# Patient Record
Sex: Female | Born: 1985 | Race: Black or African American | Hispanic: No | Marital: Single | Smoking: Current every day smoker
Health system: Southern US, Community
[De-identification: ages and names within clinical notes are randomized; demographics above are authoritative.]

## PROBLEM LIST (undated history)

## (undated) DIAGNOSIS — Z789 Other specified health status: Secondary | ICD-10-CM

## (undated) HISTORY — PX: NO PAST SURGERIES: SHX2092

---

## 2008-07-05 ENCOUNTER — Encounter (HOSPITAL_COMMUNITY): Admission: RE | Admit: 2008-07-05 | Discharge: 2008-08-04 | Payer: Self-pay | Admitting: Preventative Medicine

## 2008-11-17 ENCOUNTER — Emergency Department (HOSPITAL_COMMUNITY): Admission: EM | Admit: 2008-11-17 | Discharge: 2008-11-17 | Payer: Self-pay | Admitting: Emergency Medicine

## 2008-11-24 ENCOUNTER — Emergency Department (HOSPITAL_COMMUNITY): Admission: EM | Admit: 2008-11-24 | Discharge: 2008-11-24 | Payer: Self-pay | Admitting: Emergency Medicine

## 2008-12-18 ENCOUNTER — Emergency Department (HOSPITAL_COMMUNITY): Admission: EM | Admit: 2008-12-18 | Discharge: 2008-12-18 | Payer: Self-pay | Admitting: Emergency Medicine

## 2008-12-27 ENCOUNTER — Emergency Department (HOSPITAL_COMMUNITY): Admission: EM | Admit: 2008-12-27 | Discharge: 2008-12-27 | Payer: Self-pay | Admitting: Emergency Medicine

## 2010-11-27 LAB — URINALYSIS, ROUTINE W REFLEX MICROSCOPIC
Bilirubin Urine: NEGATIVE
Glucose, UA: NEGATIVE mg/dL
Hgb urine dipstick: NEGATIVE
Ketones, ur: 15 mg/dL — AB
Nitrite: NEGATIVE
Protein, ur: NEGATIVE mg/dL
Specific Gravity, Urine: 1.02 (ref 1.005–1.030)
Urobilinogen, UA: 1 mg/dL (ref 0.0–1.0)
pH: 6.5 (ref 5.0–8.0)

## 2016-06-10 ENCOUNTER — Other Ambulatory Visit (HOSPITAL_COMMUNITY): Payer: Self-pay | Admitting: Unknown Physician Specialty

## 2016-06-10 DIAGNOSIS — Z3A20 20 weeks gestation of pregnancy: Secondary | ICD-10-CM

## 2016-06-10 DIAGNOSIS — Z3689 Encounter for other specified antenatal screening: Secondary | ICD-10-CM

## 2016-06-10 DIAGNOSIS — IMO0001 Reserved for inherently not codable concepts without codable children: Secondary | ICD-10-CM

## 2016-06-13 ENCOUNTER — Encounter (HOSPITAL_COMMUNITY): Payer: Self-pay | Admitting: Unknown Physician Specialty

## 2016-06-23 ENCOUNTER — Encounter (HOSPITAL_COMMUNITY): Payer: Self-pay | Admitting: *Deleted

## 2016-06-24 ENCOUNTER — Ambulatory Visit (HOSPITAL_COMMUNITY): Admission: RE | Admit: 2016-06-24 | Payer: Medicaid Other | Source: Ambulatory Visit

## 2016-06-24 ENCOUNTER — Other Ambulatory Visit (HOSPITAL_COMMUNITY): Payer: Self-pay | Admitting: *Deleted

## 2016-06-24 ENCOUNTER — Ambulatory Visit (HOSPITAL_COMMUNITY)
Admission: RE | Admit: 2016-06-24 | Discharge: 2016-06-24 | Disposition: A | Discharge status: Home / Self Care | Payer: Medicaid Other | Source: Ambulatory Visit | Attending: Unknown Physician Specialty | Admitting: Unknown Physician Specialty

## 2016-06-24 ENCOUNTER — Encounter (HOSPITAL_COMMUNITY): Payer: Self-pay

## 2016-06-24 DIAGNOSIS — O99212 Obesity complicating pregnancy, second trimester: Secondary | ICD-10-CM | POA: Diagnosis not present

## 2016-06-24 DIAGNOSIS — Z363 Encounter for antenatal screening for malformations: Secondary | ICD-10-CM | POA: Diagnosis not present

## 2016-06-24 DIAGNOSIS — Z3A21 21 weeks gestation of pregnancy: Secondary | ICD-10-CM | POA: Diagnosis not present

## 2016-06-24 DIAGNOSIS — O30042 Twin pregnancy, dichorionic/diamniotic, second trimester: Secondary | ICD-10-CM | POA: Insufficient documentation

## 2016-06-24 DIAGNOSIS — IMO0001 Reserved for inherently not codable concepts without codable children: Secondary | ICD-10-CM

## 2016-06-24 DIAGNOSIS — O283 Abnormal ultrasonic finding on antenatal screening of mother: Secondary | ICD-10-CM | POA: Diagnosis not present

## 2016-06-24 DIAGNOSIS — O280 Abnormal hematological finding on antenatal screening of mother: Secondary | ICD-10-CM

## 2016-06-24 DIAGNOSIS — Z315 Encounter for genetic counseling: Secondary | ICD-10-CM | POA: Insufficient documentation

## 2016-06-24 DIAGNOSIS — Z3A20 20 weeks gestation of pregnancy: Secondary | ICD-10-CM

## 2016-06-24 DIAGNOSIS — Z3689 Encounter for other specified antenatal screening: Secondary | ICD-10-CM

## 2016-06-24 HISTORY — DX: Other specified health status: Z78.9

## 2016-06-24 NOTE — Progress Notes (Signed)
Genetic Counseling  High-Risk Gestation Note  Appointment Date:  06/24/2016 Referred By: Ruthy DickMcLeod, William, MD Date of Birth:  12-Apr-1986 Partner:  Brenda Liu   Pregnancy History: [id number]: G3P1011 Estimated Date of Delivery: 10/30/16 Estimated Gestational Age: 6452w5d Attending: Alpha GulaPaul Whitecar, MD    Ms. Brenda GrumblingKristin M Liu and her partner, Mr. Brenda Liu, were seen for genetic counseling because of an increased risk for fetal Down syndrome based on Quad screen in dichorionic diamniotic twin gestation.  In summary:  Reviewed results of Quad screening test  Increased risk for Down syndrome (1 in 263)  Discussed additional screening options  NIPS-elected to pursue today ([facility])  Ultrasound-performed today; complete results under separate cover  Discussed diagnostic testing options  Amniocentesis-declined  Reviewed family history concerns  Father of the pregnancy has maternal relatives with isolated polydactyly  Discussed general population carrier screening options-elected to pursue today through [facility]  CF  SMA  Hemoglobinopathies  They were counseled regarding the screening result and the associated 1 in 263 risk for fetal Down syndrome in the current dichorionic/diamniotic twin gestation.  We reviewed chromosomes, nondisjunction, and the common features and variable prognosis of Down syndrome.  In addition, we reviewed the screen adjusted reduction in risk for open neural tube defects. The risk for trisomy 5918 was not able to be adjusted by Quad screen given the current twin gestation. We also discussed other explanations for a screen positive result including: a gestational dating error, differences in maternal metabolism, and normal variation.  We reviewed other available screening options including noninvasive prenatal screening (NIPS)/cell free DNA (cfDNA) screening and detailed ultrasound. They were counseled that screening tests are used to modify a  patient's a priori risk for aneuploidy, typically based on age. This estimate provides a pregnancy specific risk assessment. We reviewed the benefits and limitations of each option. Specifically, we discussed the conditions for which each test screens, the detection rates, and false positive rates of each. They were also counseled regarding diagnostic testing via amniocentesis. We reviewed the approximate 1 in 300-500 risk for complications from amniocentesis, including spontaneous pregnancy loss. We discussed the possible results that the tests might provide including: positive, negative, unanticipated, and no result. Finally, they were counseled regarding the cost of each option and potential out of pocket expenses. After consideration of all the options, she/they elected to proceed with NIPS ([facility]).  Those results will be available in 8-10 days.    A detailed ultrasound was performed today. The ultrasound report will be sent under separate cover. There were no visualized fetal anomalies or markers suggestive of aneuploidy. Limited views of fetal hearts were obtained, and limited views of fetal spine for baby B were obtained today. Follow-up ultrasound scheduled for 07/22/16.   After consideration of all the options, she elected to proceed with NIPS ([facility]).  Those results will be available in 8-10 days.   Diagnostic testing was declined. They understand that screening tests cannot rule out all birth defects or genetic syndromes. The patient was advised of this limitation and states she still does not want additional testing at this time.   Ms. Brenda Liu was provided with written information regarding cystic fibrosis (CF), spinal muscular atrophy (SMA) and hemoglobinopathies including the carrier frequency, availability of carrier screening and prenatal diagnosis if indicated.  In addition, we discussed that CF and hemoglobinopathies are  routinely screened for as part of the [place] newborn screening panel.  After further discussion, she elected to pursue screening  for CF, SMA and hemoglobinopathies [facility].   Both family histories were reviewed and found to be contributory for polydactyly for the father of the pregnancy's relatives. He reported his mother and two maternal cousins were born with postaxial polydactyly. His maternal uncle also reportedly was born with postaxial polydactyly of the feet. The father of the pregnancy, his maternal half-brother, and their children reportedly do not have polydactyly. Polydactyly is typically an isolated trait that can occur sporadically in a person or inherited as a familial trait.  When inherited as an isolated trait, autosomal dominant inheritance is observed, meaning each pregnancy of a parent with polydactyly has a 50% (1 in 2) chance to inherit this genetic predisposition for polydactyly.  Not all individuals that inherit this predisposition would be born with polydactyly, but they would still be at increased risk to have affected children.  Less commonly, polydactyly may be one feature of an underlying genetic condition that may or may not be inherited. Given the reported family history of apparently isolated polydactyly in his mother and maternal relatives but no polydactyly for the father of the pregnancy of his son, recurrence risk to the current pregnancy is likely low. However, if it were due to a different underlying cause, recurrence risk estimate may change. Targeted ultrasound is available to assess for polydactyly in the pregnancy. The patient understands that ultrasound cannot rule out all birth defects prenatally. Without further information regarding the provided family history, an accurate genetic risk cannot be calculated. Further genetic counseling is warranted if more information is obtained.  Ms. Brenda GrumblingKristin M Liu denied exposure to environmental toxins or  chemical agents. She denied the use of alcohol or street drugs. She reported smoking 4 cigarettes per day. The associations of smoking in pregnancy were reviewed and cessation encouraged. She denied significant viral illnesses during the course of her pregnancy. Her medical and surgical histories were noncontributory.   I counseled this couple for approximately 45 minutes regarding the above risks and available options.   Quinn PlowmanKaren Chanah Tidmore, MS,  Certified Genetic Counselor 06/24/2016

## 2016-06-25 ENCOUNTER — Other Ambulatory Visit (HOSPITAL_COMMUNITY): Payer: Self-pay | Admitting: *Deleted

## 2016-06-25 DIAGNOSIS — IMO0002 Reserved for concepts with insufficient information to code with codable children: Secondary | ICD-10-CM

## 2016-06-25 DIAGNOSIS — Z0489 Encounter for examination and observation for other specified reasons: Secondary | ICD-10-CM

## 2016-06-26 ENCOUNTER — Encounter (HOSPITAL_COMMUNITY): Payer: Self-pay

## 2016-06-26 ENCOUNTER — Other Ambulatory Visit (HOSPITAL_COMMUNITY): Payer: Self-pay

## 2016-07-01 ENCOUNTER — Telehealth (HOSPITAL_COMMUNITY): Payer: Self-pay | Admitting: MS"

## 2016-07-01 ENCOUNTER — Other Ambulatory Visit (HOSPITAL_COMMUNITY): Payer: Self-pay

## 2016-07-01 NOTE — Telephone Encounter (Signed)
Called Brenda Liu to discuss her prenatal cell free DNA test results.  Ms. Brenda Liu had [facility] testing through [facility].  Testing was offered because of abnormal Quad screen.   The patient was identified by name and DOB.  We reviewed that these are within normal limits, showing a less than 1 in 4,000 risk for trisomy 21 and less than 1 in 10,000 risk for trisomies 18 and 13. We discussed that the report is also consistent with dizygosity (fraternal twins). Thus, X chromosome analysis was not able to be performed, as we had previously discussed.  We reviewed that this testing identifies > 99% of pregnancies with trisomy 4121, trisomy 6513, and 96% of pregnancies with trisomy 1518.   Testing was also consistent with female fetal sex for both.  She understands that this testing does not identify all genetic conditions.  All questions were answered to her satisfaction, she was encouraged to call with additional questions or concerns.  Quinn PlowmanKaren Cassadee Vanzandt, MS Patent attorneyCertified Genetic Counselor

## 2016-07-01 NOTE — Telephone Encounter (Signed)
Left message with individual who answered phone for patient to return call.  Clydie BraunKaren Lonny Eisen 07/01/2016 12:11 PM

## 2016-07-03 ENCOUNTER — Telehealth (HOSPITAL_COMMUNITY): Payer: Self-pay | Admitting: MS"

## 2016-07-03 NOTE — Telephone Encounter (Signed)
Attempted to contact patient regarding results of carrier screening for cystic fibrosis, hemoglobinopathies, and SMA, which are within normal range. Left message with individual at patient's phone number for patient to return call.   Clydie BraunKaren Magali Bray 07/03/2016 9:28 AM

## 2016-07-04 ENCOUNTER — Other Ambulatory Visit (HOSPITAL_COMMUNITY): Payer: Self-pay

## 2016-07-22 ENCOUNTER — Ambulatory Visit (HOSPITAL_COMMUNITY): Payer: Medicaid Other

## 2016-07-25 ENCOUNTER — Encounter (HOSPITAL_COMMUNITY): Payer: Self-pay

## 2016-07-25 ENCOUNTER — Ambulatory Visit (HOSPITAL_COMMUNITY)
Admission: RE | Admit: 2016-07-25 | Discharge: 2016-07-25 | Disposition: A | Discharge status: Home / Self Care | Payer: Medicaid Other | Source: Ambulatory Visit | Attending: Unknown Physician Specialty | Admitting: Unknown Physician Specialty

## 2016-07-25 DIAGNOSIS — O30042 Twin pregnancy, dichorionic/diamniotic, second trimester: Secondary | ICD-10-CM | POA: Diagnosis not present

## 2016-07-25 DIAGNOSIS — Z3A26 26 weeks gestation of pregnancy: Secondary | ICD-10-CM | POA: Diagnosis not present

## 2016-07-25 DIAGNOSIS — O321XX1 Maternal care for breech presentation, fetus 1: Secondary | ICD-10-CM | POA: Diagnosis not present

## 2016-07-25 DIAGNOSIS — Z0489 Encounter for examination and observation for other specified reasons: Secondary | ICD-10-CM

## 2016-07-25 DIAGNOSIS — O280 Abnormal hematological finding on antenatal screening of mother: Secondary | ICD-10-CM

## 2016-07-25 DIAGNOSIS — IMO0002 Reserved for concepts with insufficient information to code with codable children: Secondary | ICD-10-CM

## 2016-07-28 ENCOUNTER — Other Ambulatory Visit (HOSPITAL_COMMUNITY): Payer: Self-pay | Admitting: *Deleted

## 2016-07-28 DIAGNOSIS — O30049 Twin pregnancy, dichorionic/diamniotic, unspecified trimester: Secondary | ICD-10-CM

## 2016-08-22 ENCOUNTER — Other Ambulatory Visit (HOSPITAL_COMMUNITY): Payer: Self-pay | Admitting: Obstetrics and Gynecology

## 2016-08-22 ENCOUNTER — Encounter (HOSPITAL_COMMUNITY): Payer: Self-pay

## 2016-08-22 ENCOUNTER — Ambulatory Visit (HOSPITAL_COMMUNITY)
Admission: RE | Admit: 2016-08-22 | Discharge: 2016-08-22 | Disposition: A | Discharge status: Home / Self Care | Payer: Medicaid Other | Source: Ambulatory Visit | Attending: Unknown Physician Specialty | Admitting: Unknown Physician Specialty

## 2016-08-22 DIAGNOSIS — O280 Abnormal hematological finding on antenatal screening of mother: Secondary | ICD-10-CM

## 2016-08-22 DIAGNOSIS — O99213 Obesity complicating pregnancy, third trimester: Secondary | ICD-10-CM | POA: Insufficient documentation

## 2016-08-22 DIAGNOSIS — O283 Abnormal ultrasonic finding on antenatal screening of mother: Secondary | ICD-10-CM | POA: Insufficient documentation

## 2016-08-22 DIAGNOSIS — O99333 Smoking (tobacco) complicating pregnancy, third trimester: Secondary | ICD-10-CM | POA: Insufficient documentation

## 2016-08-22 DIAGNOSIS — Z362 Encounter for other antenatal screening follow-up: Secondary | ICD-10-CM

## 2016-08-22 DIAGNOSIS — O30049 Twin pregnancy, dichorionic/diamniotic, unspecified trimester: Secondary | ICD-10-CM

## 2016-08-22 DIAGNOSIS — Z3A30 30 weeks gestation of pregnancy: Secondary | ICD-10-CM

## 2016-08-22 DIAGNOSIS — O30043 Twin pregnancy, dichorionic/diamniotic, third trimester: Secondary | ICD-10-CM | POA: Insufficient documentation

## 2016-09-19 ENCOUNTER — Encounter (HOSPITAL_COMMUNITY): Payer: Self-pay

## 2016-09-19 ENCOUNTER — Ambulatory Visit (HOSPITAL_COMMUNITY)
Admission: RE | Admit: 2016-09-19 | Discharge: 2016-09-19 | Disposition: A | Discharge status: Home / Self Care | Payer: Medicaid Other | Source: Ambulatory Visit | Attending: Unknown Physician Specialty | Admitting: Unknown Physician Specialty

## 2016-09-19 DIAGNOSIS — O30043 Twin pregnancy, dichorionic/diamniotic, third trimester: Secondary | ICD-10-CM | POA: Insufficient documentation

## 2016-09-19 DIAGNOSIS — O99333 Smoking (tobacco) complicating pregnancy, third trimester: Secondary | ICD-10-CM | POA: Diagnosis not present

## 2016-09-19 DIAGNOSIS — O280 Abnormal hematological finding on antenatal screening of mother: Secondary | ICD-10-CM

## 2016-09-19 DIAGNOSIS — Z3A34 34 weeks gestation of pregnancy: Secondary | ICD-10-CM | POA: Insufficient documentation

## 2016-09-19 DIAGNOSIS — Z362 Encounter for other antenatal screening follow-up: Secondary | ICD-10-CM | POA: Diagnosis not present

## 2016-09-19 DIAGNOSIS — O99213 Obesity complicating pregnancy, third trimester: Secondary | ICD-10-CM | POA: Insufficient documentation

## 2016-09-19 DIAGNOSIS — O283 Abnormal ultrasonic finding on antenatal screening of mother: Secondary | ICD-10-CM | POA: Diagnosis not present

## 2016-09-19 DIAGNOSIS — O30049 Twin pregnancy, dichorionic/diamniotic, unspecified trimester: Secondary | ICD-10-CM

## 2017-02-17 ENCOUNTER — Emergency Department (HOSPITAL_COMMUNITY): Payer: Medicaid Other

## 2017-02-17 ENCOUNTER — Emergency Department (HOSPITAL_COMMUNITY)
Admission: EM | Admit: 2017-02-17 | Discharge: 2017-02-17 | Disposition: A | Discharge status: Home / Self Care | Payer: Medicaid Other | Attending: Emergency Medicine | Admitting: Emergency Medicine

## 2017-02-17 ENCOUNTER — Encounter: Payer: Self-pay | Admitting: Emergency Medicine

## 2017-02-17 DIAGNOSIS — R300 Dysuria: Secondary | ICD-10-CM | POA: Diagnosis present

## 2017-02-17 DIAGNOSIS — M25562 Pain in left knee: Secondary | ICD-10-CM | POA: Insufficient documentation

## 2017-02-17 DIAGNOSIS — N3001 Acute cystitis with hematuria: Secondary | ICD-10-CM | POA: Diagnosis not present

## 2017-02-17 DIAGNOSIS — F17200 Nicotine dependence, unspecified, uncomplicated: Secondary | ICD-10-CM | POA: Insufficient documentation

## 2017-02-17 LAB — URINALYSIS, ROUTINE W REFLEX MICROSCOPIC
Bilirubin Urine: NEGATIVE
GLUCOSE, UA: NEGATIVE mg/dL
KETONES UR: NEGATIVE mg/dL
NITRITE: NEGATIVE
PROTEIN: 100 mg/dL — AB
Specific Gravity, Urine: 1.023 (ref 1.005–1.030)
pH: 7 (ref 5.0–8.0)

## 2017-02-17 LAB — PREGNANCY, URINE: Preg Test, Ur: NEGATIVE

## 2017-02-17 MED ORDER — PHENAZOPYRIDINE HCL 200 MG PO TABS: 200.0000 mg | ORAL_TABLET | Freq: Three times a day (TID) | ORAL | 0 refills | Status: DC

## 2017-02-17 MED ORDER — IBUPROFEN 600 MG PO TABS: 600.0000 mg | ORAL_TABLET | Freq: Four times a day (QID) | ORAL | 0 refills | Status: DC | PRN

## 2017-02-17 MED ORDER — CEPHALEXIN 500 MG PO CAPS: 500.0000 mg | ORAL_CAPSULE | Freq: Four times a day (QID) | ORAL | 0 refills | Status: DC

## 2017-02-17 NOTE — ED Notes (Signed)
PA at the bedside.

## 2017-02-17 NOTE — ED Provider Notes (Signed)
[facility] DEPT Provider Note   CSN: [id number] Arrival date & time: 02/17/17  [id number]     History   Chief Complaint Chief Complaint  Patient presents with  . Knee Injury  . Dysuria    pressure after voiding    HPI Brenda Liu is a 31 y.o. female who is 4 months post partum presenting with 2 complaints, the first being a 2 day history of urinary frequency along with painful pressure sensation at the end of her urine stream in association with low back pressure like pain.  She denies fever, chills, n/v flank pain and has had no medicines prior to arrival for this problem.  Secondly, has a chronic intermittent popping and clicking sensation left knee with certain movements since she planted and twisted the knee joint 2 months ago.  She endorses intermittent episodes of worsened pain and swelling which does respond to rest and ice packs.  She has not been seen for this complaint prior to today.  She is currently menstruating and is not breast feeding.  The history is provided by the patient.    Past Medical History:  Diagnosis Date  . Medical history non-contributory     Patient Active Problem List   Diagnosis Date Noted  . Abnormal maternal serum screening test 06/24/2016  . [redacted] weeks gestation of pregnancy     Past Surgical History:  Procedure Laterality Date  . NO PAST SURGERIES      OB History    Gravida Para Term Preterm AB Living   3 1 1   1 1    SAB TAB Ectopic Multiple Live Births   1               Home Medications    Prior to Admission medications   Medication Sig Start Date End Date Taking? Authorizing Provider  cephALEXin (KEFLEX) 500 MG capsule Take 1 capsule (500 mg total) by mouth 4 (four) times daily. 02/17/17   Burgess AmorIdol, Adolph Clutter, PA-C  ibuprofen (ADVIL,MOTRIN) 600 MG tablet Take 1 tablet (600 mg total) by mouth every 6 (six) hours as needed (knee pain). 02/17/17   Burgess AmorIdol, Keirah Konitzer, PA-C  phenazopyridine (PYRIDIUM) 200 MG tablet Take 1 tablet (200 mg total) by  mouth 3 (three) times daily. 02/17/17   Burgess AmorIdol, Kinslea Frances, PA-C    Family History No family history on file.  Social History Social History  Substance Use Topics  . Smoking status: Current Every Day Smoker    Packs/day: 0.25  . Smokeless tobacco: Never Used  . Alcohol use No     Allergies   Shellfish allergy   Review of Systems Review of Systems  Constitutional: Negative for fever.  Genitourinary: Positive for dysuria, hematuria, urgency and vaginal bleeding. Negative for flank pain and vaginal discharge.  Musculoskeletal: Positive for arthralgias and joint swelling. Negative for myalgias.  Neurological: Negative for weakness and numbness.     Physical Exam Updated Vital Signs BP 138/80 (BP Location: Left Arm)   Pulse 96   Temp 97.7 F (36.5 C) (Oral)   Resp 16   Ht 5\' 7"  (1.702 m)   Wt 127 kg (280 lb)   LMP 02/15/2017   SpO2 100%   Breastfeeding? Unknown   BMI 43.85 kg/m   Physical Exam  Constitutional: She appears well-developed and well-nourished.  HENT:  Head: Normocephalic and atraumatic.  Eyes: Conjunctivae are normal.  Neck: Normal range of motion.  Cardiovascular: Normal rate, regular rhythm and normal heart sounds.   Pulses equal bilaterally  Pulmonary/Chest: Effort normal and breath sounds normal. She has no wheezes.  Abdominal: Soft. Bowel sounds are normal. She exhibits no distension and no mass. There is no tenderness. There is no guarding and no CVA tenderness.  Musculoskeletal: Normal range of motion. She exhibits tenderness.       Left knee: She exhibits normal range of motion, no swelling, no effusion, no ecchymosis, no deformity, no LCL laxity, normal meniscus and no MCL laxity. Tenderness found. Patellar tendon tenderness noted.  ttp anterior inferior patella and tendon without palpable deformity or disruption.  No crepitus appreciated at this time.  Knee joint stable.  Neurological: She is alert. She has normal strength. She displays normal  reflexes. No sensory deficit.  Skin: Skin is warm and dry.  Psychiatric: She has a normal mood and affect.  Nursing note and vitals reviewed.    ED Treatments / Results  Labs (all labs ordered are listed, but only abnormal results are displayed) Labs Reviewed  URINALYSIS, ROUTINE W REFLEX MICROSCOPIC - Abnormal; Notable for the following:       Result Value   APPearance CLOUDY (*)    Hgb urine dipstick LARGE (*)    Protein, ur 100 (*)    Leukocytes, UA LARGE (*)    Bacteria, UA MANY (*)    Squamous Epithelial / LPF 0-5 (*)    All other components within normal limits  URINE CULTURE  PREGNANCY, URINE    EKG  EKG Interpretation None       Radiology Dg Knee Complete 4 Views Left  Result Date: 02/17/2017 CLINICAL DATA:  Left knee pain following twisting injury, initial encounter EXAM: LEFT KNEE - COMPLETE 4+ VIEW COMPARISON:  None. FINDINGS: No acute fracture or dislocation is noted. Mild spurring is noted arising from the superior aspect of the patella. No other focal abnormality is noted. IMPRESSION: No acute abnormality noted. Electronically Signed   By: Alcide Clever M.D.   On: 02/17/2017 11:56    Procedures Procedures (including critical care time)  Medications Ordered in ED Medications - No data to display   Initial Impression / Assessment and Plan / ED Course  I have reviewed the triage vital signs and the nursing notes.  Pertinent labs & imaging results that were available during my care of the patient were reviewed by me and considered in my medical decision making (see chart for details).     Pt currently menstruating, refused cath, so she gave the best clean catch possible.  Will cover for uti, culture ordered.  Referral to ortho for further eval of knee sx, films negative, suspect cartilage/meniscal injury.  Final Clinical Impressions(s) / ED Diagnoses   Final diagnoses:  Acute cystitis with hematuria  Acute pain of left knee    New Prescriptions New  Prescriptions   CEPHALEXIN (KEFLEX) 500 MG CAPSULE    Take 1 capsule (500 mg total) by mouth 4 (four) times daily.   IBUPROFEN (ADVIL,MOTRIN) 600 MG TABLET    Take 1 tablet (600 mg total) by mouth every 6 (six) hours as needed (knee pain).   PHENAZOPYRIDINE (PYRIDIUM) 200 MG TABLET    Take 1 tablet (200 mg total) by mouth 3 (three) times daily.     Burgess Amor, PA-C 02/17/17 1217    Loren Racer, MD 02/19/17 1146

## 2017-02-17 NOTE — ED Triage Notes (Signed)
Pt twisted her left knee in May when playing ball, continues to have pain. Pt also complaining of pressure after voiding for the past 2 days.

## 2017-02-17 NOTE — Discharge Instructions (Signed)
Take your entire course of the antibiotics prescribed.  Remember that pyridium will turn your urine bright orange/yellow and is normal. Get rechecked if you develop any worsened pain, fevers, nausea or vomiting. Your knee xray does not show us the source of your symptoms.  You may need further testing as an injury to the cartilage can cause your symptoms but will not show on an xray.

## 2017-02-17 NOTE — ED Notes (Signed)
Patient given discharge instruction, verbalized understand. Patient ambulatory out of the department.  

## 2017-02-17 NOTE — ED Notes (Signed)
Pt ambulatory to the bathroom, with wash cloth and wipes

## 2017-02-20 LAB — URINE CULTURE
Culture: >=100000 — AB
SPECIAL REQUESTS: NORMAL

## 2017-02-21 ENCOUNTER — Telehealth: Payer: Self-pay

## 2017-02-21 NOTE — Telephone Encounter (Signed)
Post ED Visit - Positive Culture Follow-up  Culture report reviewed by antimicrobial stewardship pharmacist:  []  Enzo BiNathan Batchelder, Pharm.D. []  Celedonio MiyamotoJeremy Frens, Pharm.D., BCPS AQ-ID []  Garvin FilaMike Maccia, Pharm.D., BCPS []  Georgina PillionElizabeth Martin, Pharm.D., BCPS []  TaylorvilleMinh Pham, [address] Rainbow BoulevardPharm.D., BCPS, AAHIVP []  Estella HuskMichelle Turner, Pharm.D., BCPS, AAHIVP []  Lysle Pearlachel Rumbarger, PharmD, BCPS []  Casilda Carlsaylor Stone, PharmD, BCPS []  Pollyann SamplesAndy Johnston, PharmD, BCPS [place] HunAllison Masters Pharm D Positive urine culture Treated with Cephalexin, organism sensitive to the same and no further patient follow-up is required at this time.  Jerry CarasCullom, Jabriel Vanduyne Burnett 02/21/2017, 9:52 AM

## 2017-07-01 DIAGNOSIS — Z8482 Family history of sudden infant death syndrome: Secondary | ICD-10-CM | POA: Insufficient documentation

## 2017-07-01 DIAGNOSIS — O34219 Maternal care for unspecified type scar from previous cesarean delivery: Secondary | ICD-10-CM | POA: Insufficient documentation

## 2017-10-12 IMAGING — US US MFM OB FOLLOW-UP EACH ADDL GEST (MODIFY)
1 series · 12 of 28 positions shown · non-contrast
Comparison: none

[Series 1: us mfm ob follow-up each addl gest (modify) · 33 acquisitions, 12 frames shown]
[im 2/33]
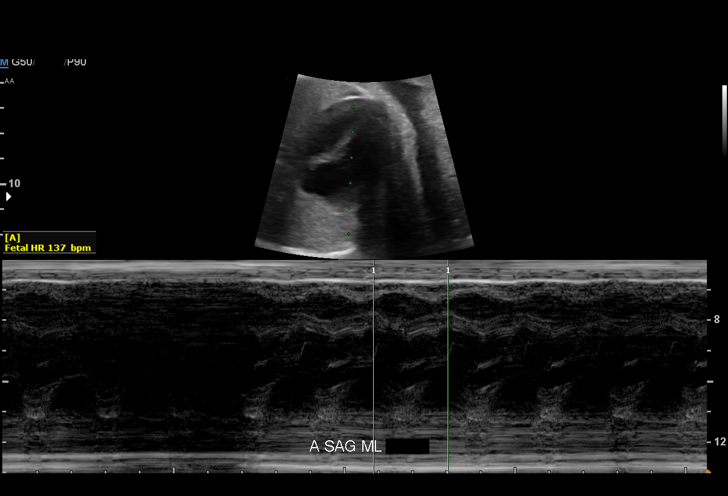
[im 4/33]
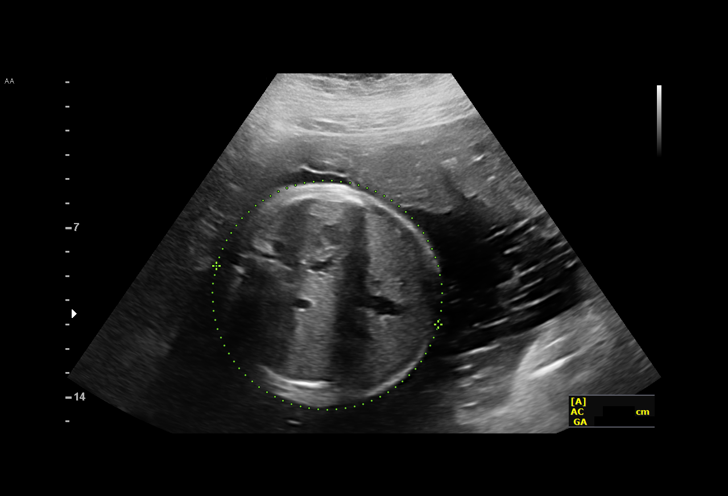
[im 6/33]
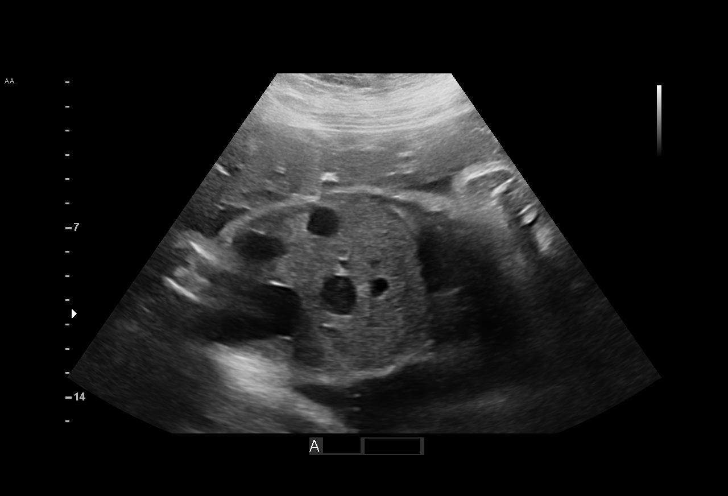
[im 10/33]
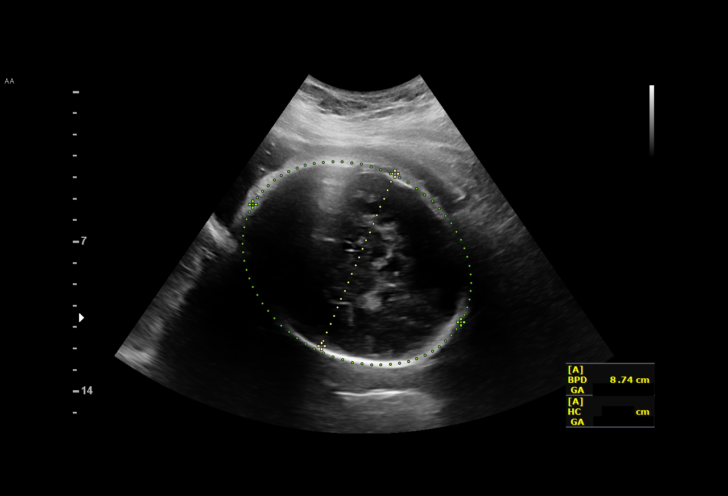
[im 12/33]
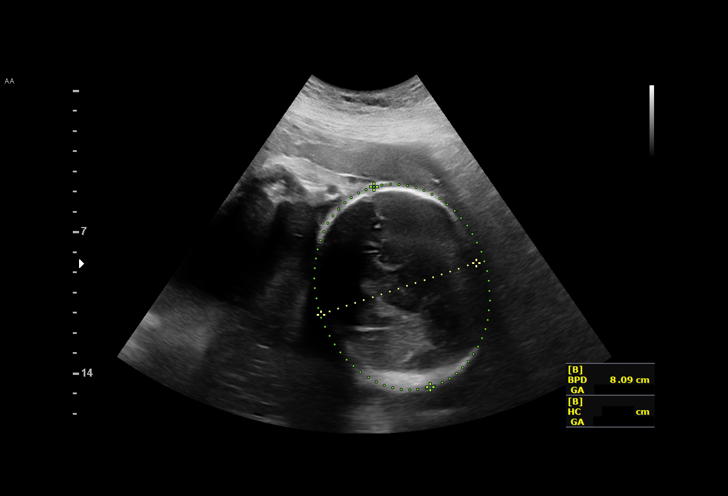
[im 15/33]
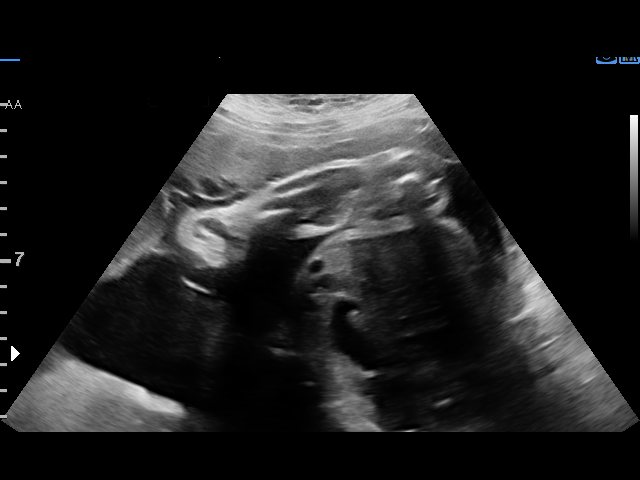
[im 18/33]
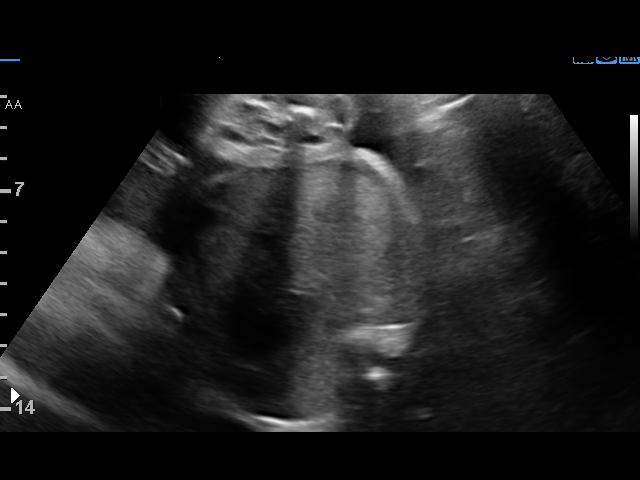
[im 21/33]
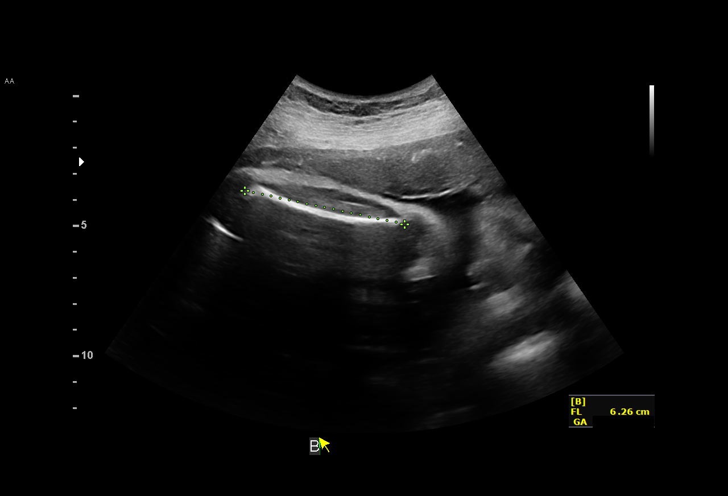
[im 23/33]
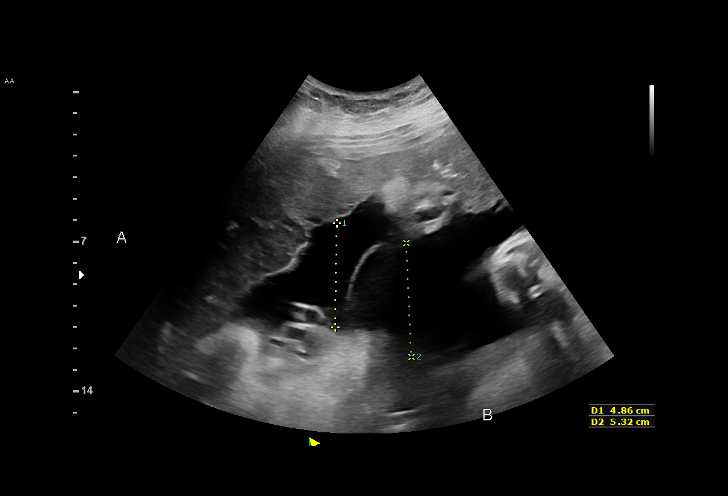
[im 27/33]
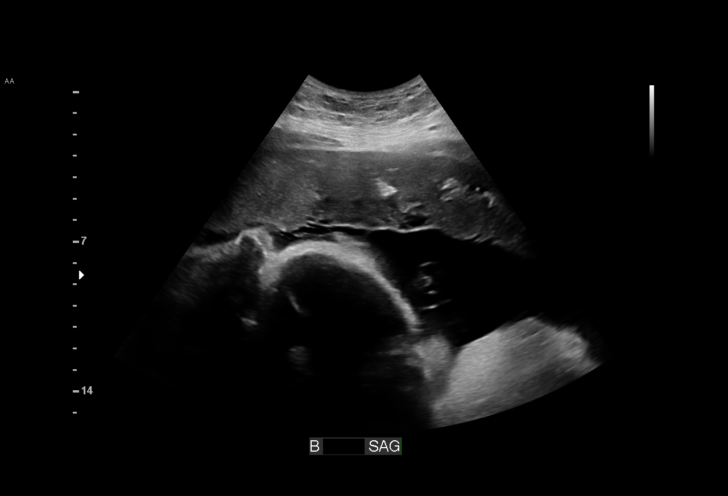
[im 29/33]
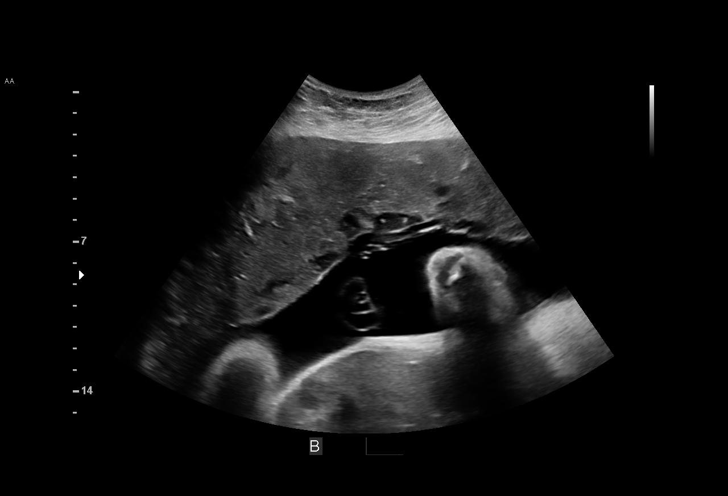
[im 31/33]
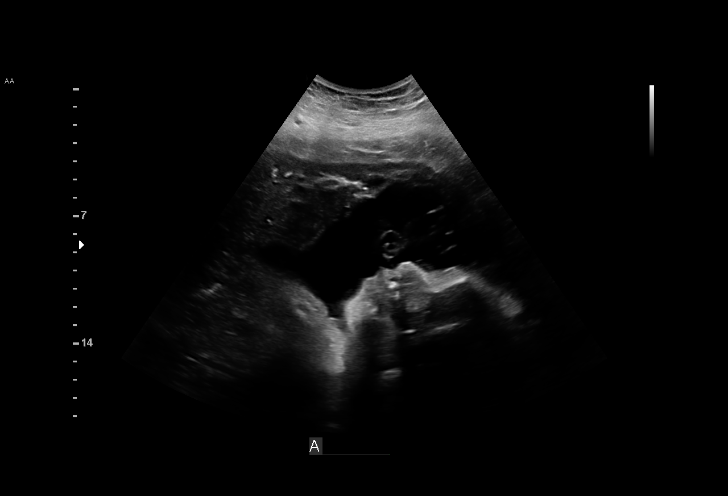

[12 of 28 positions shown; findings below may reference images not displayed]

Center
[REDACTED] [id number]

1  Putrica Muslem            [id number]       [id number]     [id number]
2  CLEMENT SCHENK            [id number]       [id number]     [id number]
Indications

34 weeks gestation of pregnancy
Abnormal biochemical screen (low risk NIPS)
Encounter for other antenatal screening
follow-up
Tobacco use complicating pregnancy, third
trimester
Obesity complicating pregnancy, third
trimester
Twin pregnancy, di/di, third trimester
OB History

Blood Type:            Height:         Weight (lb):  308      BMI:
Gravidity:    3         Term:   1        Prem:   0        SAB:   1
TOP:          0       Ectopic:  0        Living: 1
Fetal Evaluation (Fetus A)

Num Of Fetuses:     2
Fetal Heart         137
Rate(bpm):
Cardiac Activity:   Observed
Fetal Lie:          Lower Fetus
Presentation:       Cephalic
Placenta:           Anterior, above cervical os
Membrane Desc:      Dividing Membrane seen - Dichorionic.
Amniotic Fluid
AFI FV:      Subjectively within normal limits

Largest Pocket(cm)
4.9
Biometry (Fetus A)

BPD:      87.4  mm     G. Age:  35w 2d         80  %    CI:        76.24   %   70 - 86
FL/HC:      20.7   %   19.4 -
HC:      317.2  mm     G. Age:  35w 4d         53  %    HC/AC:      1.07       0.96 -
AC:       296   mm     G. Age:  33w 4d         38  %    FL/BPD:     75.2   %   71 - 87
FL:       65.7  mm     G. Age:  33w 6d         33  %    FL/AC:      22.2   %   20 - 24

Est. FW:    7555  gm      5 lb 2 oz     57  %     FW Discordancy     0 \ 14 %
Gestational Age (Fetus A)

U/S Today:     34w 4d                                        EDD:   10/27/16
Best:          34w 1d    Det. By:   Previous Ultrasound      EDD:   10/30/16
(05/27/16)
Anatomy (Fetus A)

Cranium:               Appears normal         Aortic Arch:            Previously seen
Cavum:                 Previously seen        Ductal Arch:            Previously seen
Ventricles:            Previously seen        Diaphragm:              Appears normal
Choroid Plexus:        Previously seen        Stomach:                Appears normal, left
sided
Cerebellum:            Previously seen        Abdomen:                Previously seen
Posterior Fossa:       Previously seen        Abdominal Wall:         Previously seen
Nuchal Fold:           Previously seen        Cord Vessels:           Previously seen
Face:                  Orbits and profile     Kidneys:                Appear normal
previously seen
Lips:                  Previously seen        Bladder:                Appears normal
Thoracic:              Previously seen        Spine:                  Previously seen
Heart:                 Previously seen        Upper Extremities:      Previously seen
RVOT:                  Previously seen        Lower Extremities:      Previously seen
LVOT:                  Previously seen

Other:  Male gender previously seen. Heels previously seen. Technically
difficult due to maternal habitus and fetal position.

Fetal Evaluation (Fetus B)

Num Of Fetuses:     2
Fetal Heart         148
Rate(bpm):
Cardiac Activity:   Observed
Fetal Lie:          Upper Fetus
Presentation:       Transverse, head to maternal left
Placenta:           Anterior, above cervical os
Membrane Desc:      Dividing Membrane seen - Dichorionic.

Amniotic Fluid
AFI FV:      Subjectively within normal limits

Largest Pocket(cm)
5.3
Biometry (Fetus B)

BPD:      81.3  mm     G. Age:  32w 5d         11  %    CI:         73.8   %   70 - 86
FL/HC:      20.8   %   19.4 -
HC:      300.6  mm     G. Age:  33w 3d          7  %    HC/AC:      1.05       0.96 -
AC:      285.3  mm     G. Age:  32w 4d         14  %    FL/BPD:     76.9   %   71 - 87
FL:       62.5  mm     G. Age:  32w 2d          7  %    FL/AC:      21.9   %   20 - 24

Est. FW:    2225  gm      4 lb 7 oz     30  %     FW Discordancy        14  %
Gestational Age (Fetus B)

U/S Today:     32w 5d                                        EDD:   11/09/16
Best:          34w 1d    Det. By:   Previous Ultrasound      EDD:   10/30/16
(05/27/16)
Anatomy (Fetus B)

Cranium:               Appears normal         Aortic Arch:            Previously seen
Cavum:                 Previously seen        Ductal Arch:            Previously seen
Ventricles:            Previously seen        Diaphragm:              Appears normal
Choroid Plexus:        Previously seen        Stomach:                Appears normal, left
sided
Cerebellum:            Previously seen        Abdomen:                Previously seen
Posterior Fossa:       Previously seen        Abdominal Wall:         Not well visualized
Nuchal Fold:           Previously seen        Cord Vessels:           Previously seen
Face:                  Orbits and profile     Kidneys:                Appear normal
previously seen
Lips:                  Previously seen        Bladder:                Appears normal
Thoracic:              Previously seen        Spine:                  Previously seen
Heart:                 Previously seen        Upper Extremities:      Previously seen
RVOT:                  Previously seen        Lower Extremities:      Previously seen
LVOT:                  Previously seen

Other:  Male gender previously seen. Nasal bone previously seen.
Technically difficult due to maternal habitus and fetal position.
Impression

Dichorionic/diamniotic twin pregnancy at 34+1 weeks
Cephalic/transverse presentation
Normal interval anatomy x 2; anatomic survey complete x 2
Normal amniotic fluid volume x 2
Appropriate interval growths with EFWs at the 57th %tile and
30th %tile
Recommendations

Follow-up as clinically indicated
Delivery recommended by 38 weeks

## 2019-11-30 ENCOUNTER — Other Ambulatory Visit: Payer: Self-pay | Admitting: Obstetrics & Gynecology

## 2019-11-30 ENCOUNTER — Telehealth: Payer: Self-pay | Admitting: Obstetrics & Gynecology

## 2019-11-30 DIAGNOSIS — O3680X0 Pregnancy with inconclusive fetal viability, not applicable or unspecified: Secondary | ICD-10-CM

## 2019-11-30 NOTE — Telephone Encounter (Signed)

## 2019-12-01 ENCOUNTER — Ambulatory Visit (INDEPENDENT_AMBULATORY_CARE_PROVIDER_SITE_OTHER): Payer: Medicaid Other

## 2019-12-01 ENCOUNTER — Other Ambulatory Visit: Payer: Self-pay

## 2019-12-01 DIAGNOSIS — Z3A01 Less than 8 weeks gestation of pregnancy: Secondary | ICD-10-CM

## 2019-12-01 DIAGNOSIS — O3680X0 Pregnancy with inconclusive fetal viability, not applicable or unspecified: Secondary | ICD-10-CM

## 2019-12-01 NOTE — Progress Notes (Signed)
[place] 6+3 wks,single IUP with YS,positive fht 115 bpm,normal ovaries,crl 6.08 mm,subchorionic hemorrhage 1.6 x 1.8 x .7 cm

## 2020-01-11 ENCOUNTER — Telehealth: Payer: Self-pay | Admitting: Women's Health

## 2020-01-11 ENCOUNTER — Other Ambulatory Visit: Payer: Self-pay | Admitting: Obstetrics and Gynecology

## 2020-01-11 DIAGNOSIS — Z3682 Encounter for antenatal screening for nuchal translucency: Secondary | ICD-10-CM

## 2020-01-11 NOTE — Telephone Encounter (Signed)
Unable to leave voicemail for patient to review covid restrictions and covid health screening questions. Will screen patient upon check in at upcoming appointment.

## 2020-01-12 ENCOUNTER — Ambulatory Visit (INDEPENDENT_AMBULATORY_CARE_PROVIDER_SITE_OTHER): Payer: Medicaid Other

## 2020-01-12 ENCOUNTER — Encounter: Payer: Self-pay | Admitting: Women's Health

## 2020-01-12 ENCOUNTER — Ambulatory Visit: Payer: Medicaid Other | Admitting: *Deleted

## 2020-01-12 ENCOUNTER — Ambulatory Visit (INDEPENDENT_AMBULATORY_CARE_PROVIDER_SITE_OTHER): Payer: Medicaid Other | Admitting: Women's Health

## 2020-01-12 VITALS — BP 127/72 | HR 89 | Wt 323.8 lb

## 2020-01-12 DIAGNOSIS — O34219 Maternal care for unspecified type scar from previous cesarean delivery: Secondary | ICD-10-CM

## 2020-01-12 DIAGNOSIS — Z6841 Body Mass Index (BMI) 40.0 and over, adult: Secondary | ICD-10-CM

## 2020-01-12 DIAGNOSIS — F17200 Nicotine dependence, unspecified, uncomplicated: Secondary | ICD-10-CM | POA: Insufficient documentation

## 2020-01-12 DIAGNOSIS — Z3A13 13 weeks gestation of pregnancy: Secondary | ICD-10-CM | POA: Diagnosis not present

## 2020-01-12 DIAGNOSIS — O99331 Smoking (tobacco) complicating pregnancy, first trimester: Secondary | ICD-10-CM | POA: Diagnosis not present

## 2020-01-12 DIAGNOSIS — Z3682 Encounter for antenatal screening for nuchal translucency: Secondary | ICD-10-CM

## 2020-01-12 DIAGNOSIS — O99211 Obesity complicating pregnancy, first trimester: Secondary | ICD-10-CM

## 2020-01-12 DIAGNOSIS — Z3A12 12 weeks gestation of pregnancy: Secondary | ICD-10-CM

## 2020-01-12 DIAGNOSIS — Z3481 Encounter for supervision of other normal pregnancy, first trimester: Secondary | ICD-10-CM

## 2020-01-12 DIAGNOSIS — E6609 Other obesity due to excess calories: Secondary | ICD-10-CM | POA: Diagnosis not present

## 2020-01-12 DIAGNOSIS — Z3480 Encounter for supervision of other normal pregnancy, unspecified trimester: Secondary | ICD-10-CM | POA: Insufficient documentation

## 2020-01-12 DIAGNOSIS — Z8482 Family history of sudden infant death syndrome: Secondary | ICD-10-CM

## 2020-01-12 LAB — POCT URINALYSIS DIPSTICK OB
Blood, UA: NEGATIVE
Glucose, UA: NEGATIVE
Ketones, UA: NEGATIVE
Leukocytes, UA: NEGATIVE
Nitrite, UA: NEGATIVE
POC,PROTEIN,UA: NEGATIVE

## 2020-01-12 MED ORDER — DOXYLAMINE-PYRIDOXINE 10-10 MG PO TBEC: DELAYED_RELEASE_TABLET | ORAL | 6 refills | Status: DC

## 2020-01-12 MED ORDER — HYDROCORTISONE 1 % EX OINT: 1.0000 "application " | TOPICAL_OINTMENT | Freq: Two times a day (BID) | CUTANEOUS | 2 refills | Status: AC

## 2020-01-12 NOTE — Progress Notes (Signed)
INITIAL OBSTETRICAL VISIT Patient name: Brenda Liu MRN [id number]  Date of birth: 04/10/1986 Chief Complaint:   Initial Prenatal Visit (nt/it)  History of Present Illness:   Brenda Liu is a 34 y.o. [phone] African American female at [redacted]w[redacted]d by 6wk u/s, with an Estimated Date of Delivery: 07/23/20 being seen today for her initial obstetrical visit.  Epic down during entire visit. Her obstetrical history is significant for SVB x 1 then C/S for twins w/ baby B 'not moving very much', then elective RCS. Twin B passed away at old from SIDS.  Denies h/o HTN, DM, PTL, etc. Smoker- 1.5ppd prior to pregnancy, now down to 1/2ppd, usually continues to decrease amount as pregnancy progressed but doesn't quit completely. Declines QuitlineNC.  Eczema b/w breasts, PCP gave her hydrocortisone which isn't really helping- but is only using 2x/wk instead of BID.  Today she reports nausea, requests meds.  Depression screen [facility] 2/9 01/12/2020  Decreased Interest 0  Down, Depressed, Hopeless 0  PHQ - 2 Score 0  Altered sleeping 0  Tired, decreased energy 3  Change in appetite 0  Feeling bad or failure about yourself  0  Trouble concentrating 0  Moving slowly or fidgety/restless 0  Suicidal thoughts 0  PHQ-9 Score 3  Difficult doing work/chores Not difficult at all    No LMP recorded (lmp unknown). Patient is pregnant. Last pap 2019. Results were: normal Review of Systems:   Pertinent items are noted in HPI Denies cramping/contractions, leakage of fluid, vaginal bleeding, abnormal vaginal discharge w/ itching/odor/irritation, headaches, visual changes, shortness of breath, chest pain, abdominal pain, severe nausea/vomiting, or problems with urination or bowel movements unless otherwise stated above.  Pertinent History Reviewed:  Reviewed past medical,surgical, social, obstetrical and family history.  Reviewed problem list, medications and allergies. OB History  Gravida Para Term Preterm  AB Living  5 3 3   1 3   SAB TAB Ectopic Multiple Live Births  1     1 4     # Outcome Date GA Lbr Len/2nd Weight Sex Delivery Anes PTL Lv  5 Current           4 Term 01/05/18 [redacted]w[redacted]d  6 lb 8 oz (2.948 kg) F CS-LTranv Spinal N LIV  3A Term 10/14/16 [redacted]w[redacted]d  6 lb 4 oz (2.835 kg) M CS-LTranv Spinal N LIV  3B Term 10/14/16 [redacted]w[redacted]d  4 lb 6 oz (1.984 kg) M CS-LTranv Spinal N DEC  2 SAB 2017          1 Term 04/01/15 [redacted]w[redacted]d  6 lb 8 oz (2.948 kg) M Vag-Spont EPI N LIV   Physical Assessment:   Vitals:   01/12/20 1137  BP: 127/72  Pulse: 89  Weight: (!) 323 lb 12.8 oz (146.9 kg)  Body mass index is 50.71 kg/m.       Physical Examination:  General appearance - well appearing, and in no distress  Mental status - alert, oriented to person, place, and time  Psych:  She has a normal mood and affect  Skin - warm and dry, normal color, no suspicious lesions noted, eczematous rash on breasts/chest  Chest - effort normal, all lung fields clear to auscultation bilaterally  Heart - normal rate and regular rhythm  Abdomen - soft, nontender  Extremities:  No swelling or varicosities noted  Pelvic - VULVA: normal appearing vulva with no masses, tenderness or lesions  VAGINA: normal appearing vagina with normal color and discharge, no lesions  CERVIX: normal appearing  cervix without discharge or lesions, no CMT  Thin prep pap is not done   TODAY'S NT [place] 12+3 wks,measurements c/w dates,63 mm,NB present,NT 2 mm,fhr 141 bpm,posterior fundal placenta,normal ovaries  Results for orders placed or performed in visit on 01/12/20 (from the past 24 hour(s))  POC Urinalysis Dipstick OB   Collection Time: 01/12/20  2:02 PM  Result Value Ref Range   Color, UA     Clarity, UA     Glucose, UA Negative Negative   Bilirubin, UA     Ketones, UA neg    Spec Grav, UA     Blood, UA neg    pH, UA     POC,PROTEIN,UA Negative Negative, Trace, Small (1+), Moderate (2+), Large (3+), 4+   Urobilinogen, UA     Nitrite, UA neg     Leukocytes, UA Negative Negative   Appearance     Odor      Assessment & Plan:  1) Low-Risk Pregnancy [id number] at [redacted]w[redacted]d with an Estimated Date of Delivery: 07/23/20   2) Initial OB visit  3) Prev c/s x2> wants RCS w/ BTL  4) H/O old son passing away d/t SIDS  5) Smoker> Smokes 1/2pp/day, counseled x 3-41mins, advised cessation, discussed risks to fetus while pregnant, to infant pp, and to herself. Offered QuitlineNC, declined.  6) Nausea> rx diclegis  7) Eczema> rx hydrocortisone, use BID  Meds:  Meds ordered this encounter  Medications  . Doxylamine-Pyridoxine (DICLEGIS) 10-10 MG TBEC    Sig: 2 tabs q hs, if sx persist add 1 tab q am on day 3, if sx persist add 1 tab q afternoon on day 4    Dispense:  100 tablet    Refill:  6    Order Specific Question:   Supervising Provider    Answer:   Despina Hidden, LUTHER H [[id number]]  . hydrocortisone 1 % ointment    Sig: Apply 1 application topically 2 (two) times daily.    Dispense:  30 g    Refill:  2    Order Specific Question:   Supervising Provider    Answer:   Duane Lope H [[id number]]    Initial labs obtained Continue prenatal vitamins Reviewed n/v relief measures and warning s/s to report Reviewed recommended weight gain based on pre-gravid BMI Encouraged well-balanced diet Genetic & carrier screening discussed: requests Panorama, NT/IT and Horizon 14   Declines UDS, reports she used THC prior to pregnancy and doesn't want it to show in her urine today.  Ultrasound discussed; fetal survey: requested CCNC completed> form faxed if has or is planning to apply for medicaid The nature of [facility] [employer] with multiple MDs and other Advanced Practice Providers was explained to patient; also emphasized that fellows, residents, and students are part of our team. Has home bp cuff.  Check bp weekly, let us know if >140/90.   Indications for early A1C (per uptodate) BMI >=25 (>=23 in Asian women) AND one of the  following High-risk race/ethnicity (eg, African American, Latino, Native American, [place] American, [place] Islander) Yes  Other clinical condition associated with insulin resistance (eg, severe obesity, acanthosis nigricans) Yes   Follow-up: Return in about 3 weeks (around 02/02/2020) for LROB, MyChart Video, CNM.   Orders Placed This Encounter  Procedures  . Urine Culture  . GC/Chlamydia Probe Amp  . Genetic Screening  . CBC/D/Plt+RPR+Rh+ABO+Rub Ab...  . Integrated 1  . Hemoglobin A1c  . POC Urinalysis Dipstick OB  [place], [facility] 01/12/2020 2:16 PM

## 2020-01-12 NOTE — Progress Notes (Signed)
[place] 12+3 wks,measurements c/w dates,63 mm,NB present,NT 2 mm,fhr 141 bpm,posterior fundal placenta,normal ovaries

## 2020-01-12 NOTE — Patient Instructions (Signed)
Brenda Liu, I greatly value your feedback.  If you receive a survey following your visit with [place] today, we appreciate you taking the time to fill it out.  Thanks, Knute Neu, CNM, WHNP-BC   [facility] [place] [facility] ([place], [place] [address]) Entrance C, located off of [address] parking   Nausea & Vomiting  Have saltine crackers or pretzels by your bed and eat a few bites before you raise your head out of bed in the morning  Eat small frequent meals throughout the day instead of large meals  Drink plenty of fluids throughout the day to stay hydrated, just don't drink a lot of fluids with your meals.  This can make your stomach fill up faster making you feel sick  Do not brush your teeth right after you eat  Products with real ginger are good for nausea, like ginger ale and ginger hard candy Make sure it says made with real ginger!  Sucking on sour candy like lemon heads is also good for nausea  If your prenatal vitamins make you nauseated, take them at night so you will sleep through the nausea  Sea Bands  If you feel like you need medicine for the nausea & vomiting please let us know  If you are unable to keep any fluids or food down please let us know   Constipation  Drink plenty of fluid, preferably water, throughout the day  Eat foods high in fiber such as fruits, vegetables, and grains  Exercise, such as walking, is a good way to keep your bowels regular  Drink warm fluids, especially warm prune juice, or decaf coffee  Eat a 1/2 cup of real oatmeal (not instant), 1/2 cup applesauce, and 1/2-1 cup warm prune juice every day  If needed, you may take Colace (docusate sodium) stool softener once or twice a day to help keep the stool soft.   If you still are having problems with constipation, you may take Miralax once daily as needed to help keep your bowels regular.   Home Blood Pressure Monitoring for Patients   Your provider has recommended that you check your blood pressure (BP) at least once a week at home. If you do not have a blood pressure cuff at home, one will be provided for you. Contact your provider if you have not received your monitor within 1 week.   Helpful Tips for Accurate Home Blood Pressure Checks  . Don't smoke, exercise, or drink caffeine 30 minutes before checking your BP . Use the restroom before checking your BP (a full bladder can raise your pressure) . Relax in a comfortable upright chair . Feet on the ground . Left arm resting comfortably on a flat surface at the level of your heart . Legs uncrossed . Back supported . Sit quietly and don't talk . Place the cuff on your bare arm . Adjust snuggly, so that only two fingertips can fit between your skin and the top of the cuff . Check 2 readings separated by at least one minute . Keep a log of your BP readings . For a visual, please reference this diagram: http://ccnc.care/bpdiagram  Provider Name: [facility]     Phone: [phone]  Zone 1: ALL CLEAR  Continue to monitor your symptoms:  . BP reading is less than 140 (top number) or less than 90 (bottom number)  . No right upper stomach pain . No headaches or seeing  spots . No feeling nauseated or throwing up . No swelling in face and hands  Zone 2: CAUTION Call your doctor's office for any of the following:  . BP reading is greater than 140 (top number) or greater than 90 (bottom number)  . Stomach pain under your ribs in the middle or right side . Headaches or seeing spots . Feeling nauseated or throwing up . Swelling in face and hands  Zone 3: EMERGENCY  Seek immediate medical care if you have any of the following:  . BP reading is greater than160 (top number) or greater than 110 (bottom number) . Severe headaches not improving with Tylenol . Serious difficulty catching your breath . Any worsening symptoms from Zone 2    First Trimester of Pregnancy  The first trimester of pregnancy is from week 1 until the end of week 12 (months 1 through 3). A week after a sperm fertilizes an egg, the egg will implant on the wall of the uterus. This embryo will begin to develop into a baby. Genes from you and your partner are forming the baby. The female genes determine whether the baby is a boy or a girl. At 6-8 weeks, the eyes and face are formed, and the heartbeat can be seen on ultrasound. At the end of 12 weeks, all the baby's organs are formed.  Now that you are pregnant, you will want to do everything you can to have a healthy baby. Two of the most important things are to get good prenatal care and to follow your health care provider's instructions. Prenatal care is all the medical care you receive before the baby's birth. This care will help prevent, find, and treat any problems during the pregnancy and childbirth. BODY CHANGES Your body goes through many changes during pregnancy. The changes vary from woman to woman.   You may gain or lose a couple of pounds at first.  You may feel sick to your stomach (nauseous) and throw up (vomit). If the vomiting is uncontrollable, call your health care provider.  You may tire easily.  You may develop headaches that can be relieved by medicines approved by your health care provider.  You may urinate more often. Painful urination may mean you have a bladder infection.  You may develop heartburn as a result of your pregnancy.  You may develop constipation because certain hormones are causing the muscles that push waste through your intestines to slow down.  You may develop hemorrhoids or swollen, bulging veins (varicose veins).  Your breasts may begin to grow larger and become tender. Your nipples may stick out more, and the tissue that surrounds them (areola) may become darker.  Your gums may bleed and may be sensitive to brushing and flossing.  Dark spots or blotches (chloasma, mask of pregnancy) may  develop on your face. This will likely fade after the baby is born.  Your menstrual periods will stop.  You may have a loss of appetite.  You may develop cravings for certain kinds of food.  You may have changes in your emotions from day to day, such as being excited to be pregnant or being concerned that something may go wrong with the pregnancy and baby.  You may have more vivid and strange dreams.  You may have changes in your hair. These can include thickening of your hair, rapid growth, and changes in texture. Some women also have hair loss during or after pregnancy, or hair that feels dry or thin. Your hair  will most likely return to normal after your baby is born. WHAT TO EXPECT AT YOUR PRENATAL VISITS During a routine prenatal visit:  You will be weighed to make sure you and the baby are growing normally.  Your blood pressure will be taken.  Your abdomen will be measured to track your baby's growth.  The fetal heartbeat will be listened to starting around week 10 or 12 of your pregnancy.  Test results from any previous visits will be discussed. Your health care provider may ask you:  How you are feeling.  If you are feeling the baby move.  If you have had any abnormal symptoms, such as leaking fluid, bleeding, severe headaches, or abdominal cramping.  If you have any questions. Other tests that may be performed during your first trimester include:  Blood tests to find your blood type and to check for the presence of any previous infections. They will also be used to check for low iron levels (anemia) and Rh antibodies. Later in the pregnancy, blood tests for diabetes will be done along with other tests if problems develop.  Urine tests to check for infections, diabetes, or protein in the urine.  An ultrasound to confirm the proper growth and development of the baby.  An amniocentesis to check for possible genetic problems.  Fetal screens for spina bifida and Down  syndrome.  You may need other tests to make sure you and the baby are doing well. HOME CARE INSTRUCTIONS  Medicines  Follow your health care provider's instructions regarding medicine use. Specific medicines may be either safe or unsafe to take during pregnancy.  Take your prenatal vitamins as directed.  If you develop constipation, try taking a stool softener if your health care provider approves. Diet  Eat regular, well-balanced meals. Choose a variety of foods, such as meat or vegetable-based protein, fish, milk and low-fat dairy products, vegetables, fruits, and whole grain breads and cereals. Your health care provider will help you determine the amount of weight gain that is right for you.  Avoid raw meat and uncooked cheese. These carry germs that can cause birth defects in the baby.  Eating four or five small meals rather than three large meals a day may help relieve nausea and vomiting. If you start to feel nauseous, eating a few soda crackers can be helpful. Drinking liquids between meals instead of during meals also seems to help nausea and vomiting.  If you develop constipation, eat more high-fiber foods, such as fresh vegetables or fruit and whole grains. Drink enough fluids to keep your urine clear or pale yellow. Activity and Exercise  Exercise only as directed by your health care provider. Exercising will help you:  Control your weight.  Stay in shape.  Be prepared for labor and delivery.  Experiencing pain or cramping in the lower abdomen or low back is a good sign that you should stop exercising. Check with your health care provider before continuing normal exercises.  Try to avoid standing for long periods of time. Move your legs often if you must stand in one place for a long time.  Avoid heavy lifting.  Wear low-heeled shoes, and practice good posture.  You may continue to have sex unless your health care provider directs you otherwise. Relief of Pain or  Discomfort  Wear a good support bra for breast tenderness.    Take warm sitz baths to soothe any pain or discomfort caused by hemorrhoids. Use hemorrhoid cream if your health care provider approves.  Rest with your legs elevated if you have leg cramps or low back pain.  If you develop varicose veins in your legs, wear support hose. Elevate your feet for 15 minutes, 3-4 times a day. Limit salt in your diet. Prenatal Care  Schedule your prenatal visits by the twelfth week of pregnancy. They are usually scheduled monthly at first, then more often in the last 2 months before delivery.  Write down your questions. Take them to your prenatal visits.  Keep all your prenatal visits as directed by your health care provider. Safety  Wear your seat belt at all times when driving.  Make a list of emergency phone numbers, including numbers for family, friends, the hospital, and police and fire departments. General Tips  Ask your health care provider for a referral to a local prenatal education class. Begin classes no later than at the beginning of month 6 of your pregnancy.  Ask for help if you have counseling or nutritional needs during pregnancy. Your health care provider can offer advice or refer you to specialists for help with various needs.  Do not use hot tubs, steam rooms, or saunas.  Do not douche or use tampons or scented sanitary pads.  Do not cross your legs for long periods of time.  Avoid cat litter boxes and soil used by cats. These carry germs that can cause birth defects in the baby and possibly loss of the fetus by miscarriage or stillbirth.  Avoid all smoking, herbs, alcohol, and medicines not prescribed by your health care provider. Chemicals in these affect the formation and growth of the baby.  Schedule a dentist appointment. At home, brush your teeth with a soft toothbrush and be gentle when you floss. SEEK MEDICAL CARE IF:   You have dizziness.  You have mild  pelvic cramps, pelvic pressure, or nagging pain in the abdominal area.  You have persistent nausea, vomiting, or diarrhea.  You have a bad smelling vaginal discharge.  You have pain with urination.  You notice increased swelling in your face, hands, legs, or ankles. SEEK IMMEDIATE MEDICAL CARE IF:   You have a fever.  You are leaking fluid from your vagina.  You have spotting or bleeding from your vagina.  You have severe abdominal cramping or pain.  You have rapid weight gain or loss.  You vomit blood or material that looks like coffee grounds.  You are exposed to [place] measles and have never had them.  You are exposed to fifth disease or chickenpox.  You develop a severe headache.  You have shortness of breath.  You have any kind of trauma, such as from a fall or a car accident. Document Released: 07/29/2001 Document Revised: 12/19/2013 Document Reviewed: 06/14/2013 [facility] Patient Information 2015 [place], [place]. This information is not intended to replace advice given to you by your health care provider. Make sure you discuss any questions you have with your health care provider.

## 2020-01-13 ENCOUNTER — Other Ambulatory Visit: Payer: Self-pay | Admitting: Women's Health

## 2020-01-13 MED ORDER — FERROUS SULFATE 325 (65 FE) MG PO TABS: 325.0000 mg | ORAL_TABLET | Freq: Two times a day (BID) | ORAL | 3 refills | Status: AC

## 2020-01-14 ENCOUNTER — Other Ambulatory Visit: Payer: Self-pay | Admitting: Women's Health

## 2020-01-14 ENCOUNTER — Encounter: Payer: Self-pay | Admitting: Women's Health

## 2020-01-14 DIAGNOSIS — R8271 Bacteriuria: Secondary | ICD-10-CM | POA: Insufficient documentation

## 2020-01-14 LAB — CBC/D/PLT+RPR+RH+ABO+RUB AB...
Antibody Screen: NEGATIVE
Basophils Absolute: 0 10*3/uL (ref 0.0–0.2)
Basos: 0 %
EOS (ABSOLUTE): 0.1 10*3/uL (ref 0.0–0.4)
Eos: 1 %
HCV Ab: <0.1 s/co ratio (ref 0.0–0.9)
HIV Screen 4th Generation wRfx: NONREACTIVE
Hematocrit: 32.7 % — ABNORMAL LOW (ref 34.0–46.6)
Hemoglobin: 10.8 g/dL — ABNORMAL LOW (ref 11.1–15.9)
Hepatitis B Surface Ag: NEGATIVE
Immature Grans (Abs): 0.1 10*3/uL (ref 0.0–0.1)
Immature Granulocytes: 1 %
Lymphocytes Absolute: 3.7 10*3/uL — ABNORMAL HIGH (ref 0.7–3.1)
Lymphs: 29 %
MCH: 28.3 pg (ref 26.6–33.0)
MCHC: 33 g/dL (ref 31.5–35.7)
MCV: 86 fL (ref 79–97)
Monocytes Absolute: 0.6 10*3/uL (ref 0.1–0.9)
Monocytes: 5 %
Neutrophils Absolute: 8.2 10*3/uL — ABNORMAL HIGH (ref 1.4–7.0)
Neutrophils: 64 %
Platelets: 371 10*3/uL (ref 150–450)
RBC: 3.81 x10E6/uL (ref 3.77–5.28)
RDW: 14.9 % (ref 11.7–15.4)
RPR Ser Ql: NONREACTIVE
Rh Factor: POSITIVE
Rubella Antibodies, IGG: 1.64 index (ref >0.99)
WBC: 12.7 10*3/uL — ABNORMAL HIGH (ref 3.4–10.8)

## 2020-01-14 LAB — INTEGRATED 1
Crown Rump Length: 63 mm
Gest. Age on Collection Date: 12.6 weeks
Maternal Age at EDD: 33.9 yr
Nuchal Translucency (NT): 2 mm
Number of Fetuses: 1
PAPP-A Value: 272.2 ng/mL
Weight: 323 [lb_av]

## 2020-01-14 LAB — HEMOGLOBIN A1C
Est. average glucose Bld gHb Est-mCnc: 105 mg/dL
Hgb A1c MFr Bld: 5.3 % (ref 4.8–5.6)

## 2020-01-14 LAB — HCV INTERPRETATION

## 2020-01-14 LAB — URINE CULTURE

## 2020-01-14 LAB — SPECIMEN STATUS REPORT

## 2020-01-14 MED ORDER — AMOXICILLIN 500 MG PO CAPS: 500.0000 mg | ORAL_CAPSULE | Freq: Two times a day (BID) | ORAL | 0 refills | Status: DC

## 2020-01-15 LAB — GC/CHLAMYDIA PROBE AMP
Chlamydia trachomatis, NAA: NEGATIVE
Neisseria Gonorrhoeae by PCR: NEGATIVE

## 2020-01-15 LAB — SPECIMEN STATUS REPORT

## 2020-02-06 ENCOUNTER — Ambulatory Visit (INDEPENDENT_AMBULATORY_CARE_PROVIDER_SITE_OTHER): Payer: Medicaid Other | Admitting: Women's Health

## 2020-02-06 ENCOUNTER — Encounter: Payer: Self-pay | Admitting: Women's Health

## 2020-02-06 ENCOUNTER — Encounter: Payer: Medicaid Other | Admitting: Women's Health

## 2020-02-06 ENCOUNTER — Other Ambulatory Visit (HOSPITAL_COMMUNITY)
Admission: RE | Admit: 2020-02-06 | Discharge: 2020-02-06 | Disposition: A | Discharge status: Home / Self Care | Payer: Medicaid Other | Source: Ambulatory Visit | Attending: Obstetrics and Gynecology | Admitting: Obstetrics and Gynecology

## 2020-02-06 VITALS — BP 104/62 | HR 113 | Wt 326.4 lb

## 2020-02-06 DIAGNOSIS — Z3A16 16 weeks gestation of pregnancy: Secondary | ICD-10-CM

## 2020-02-06 DIAGNOSIS — Z363 Encounter for antenatal screening for malformations: Secondary | ICD-10-CM

## 2020-02-06 DIAGNOSIS — Z1389 Encounter for screening for other disorder: Secondary | ICD-10-CM

## 2020-02-06 DIAGNOSIS — Z3480 Encounter for supervision of other normal pregnancy, unspecified trimester: Secondary | ICD-10-CM

## 2020-02-06 DIAGNOSIS — O209 Hemorrhage in early pregnancy, unspecified: Secondary | ICD-10-CM | POA: Insufficient documentation

## 2020-02-06 DIAGNOSIS — Z1379 Encounter for other screening for genetic and chromosomal anomalies: Secondary | ICD-10-CM

## 2020-02-06 DIAGNOSIS — Z3482 Encounter for supervision of other normal pregnancy, second trimester: Secondary | ICD-10-CM

## 2020-02-06 DIAGNOSIS — Z87440 Personal history of urinary (tract) infections: Secondary | ICD-10-CM

## 2020-02-06 DIAGNOSIS — Z331 Pregnant state, incidental: Secondary | ICD-10-CM

## 2020-02-06 LAB — POCT URINALYSIS DIPSTICK OB
Blood, UA: NEGATIVE
Glucose, UA: NEGATIVE
Leukocytes, UA: NEGATIVE
Nitrite, UA: NEGATIVE
POC,PROTEIN,UA: NEGATIVE

## 2020-02-06 MED ORDER — BLOOD PRESSURE MONITOR MISC: 0 refills | Status: AC

## 2020-02-06 NOTE — Patient Instructions (Addendum)
Brenda Liu, I greatly value your feedback.  If you receive a survey following your visit with [place] today, we appreciate you taking the time to fill it out.  Thanks, Brenda Liu, CNM, WHNP-BC  [facility] ([address] [place], [place] [address]) Entrance C, located off of [address] valet parking   NO SEX or anything in the vagina for now  Go to [email or web address] to register for FREE online childbirth classes  [place] Pediatricians/Family Doctors:  [facility] [phone]            [facility] [phone]                 [facility] [phone] (usually not accepting new patients unless you have family there already, you are always welcome to call and ask)       [facility] [phone]       [facility] Pediatricians/Family Doctors:   [facility]: [phone]  [facility]: [phone]  [facility]: [phone]  [facility] Doctors:   [facility]: [phone]   [facility]: [phone]  [facility] Doctors:  [facility]: [phone]    Home Blood Pressure Monitoring for Patients   Your provider has recommended that you check your blood pressure (BP) at least once a week at home. If you do not have a blood pressure cuff at home, one will be provided for you. Contact your provider if you have not received your monitor within 1 week.   Helpful Tips for Accurate Home Blood Pressure Checks  . Don't smoke, exercise, or drink caffeine 30 minutes before checking your BP . Use the restroom before checking your BP (a full bladder can raise your pressure) . Relax in a comfortable upright chair . Feet on the ground . Left arm resting comfortably on a flat surface at the level of your heart . Legs uncrossed . Back supported . Sit quietly and don't talk . Place  the cuff on your bare arm . Adjust snuggly, so that only two fingertips can fit between your skin and the top of the cuff . Check 2 readings separated by at least one minute . Keep a log of your BP readings . For a visual, please reference this diagram: http://ccnc.care/bpdiagram  Provider Name: [facility]     Phone: [phone]  Zone 1: ALL CLEAR  Continue to monitor your symptoms:  . BP reading is less than 140 (top number) or less than 90 (bottom number)  . No right upper stomach pain . No headaches or seeing spots . No feeling nauseated or throwing up . No swelling in face and hands  Zone 2: CAUTION Call your doctor's office for any of the following:  . BP reading is greater than 140 (top number) or greater than 90 (bottom number)  . Stomach pain under your ribs in the middle or right side . Headaches or seeing spots . Feeling nauseated or throwing up . Swelling in face and hands  Zone 3: EMERGENCY  Seek immediate medical care if you have any of the following:  . BP reading is greater than160 (top number) or greater than 110 (bottom number) . Severe headaches not improving with Tylenol . Serious difficulty catching your breath . Any worsening symptoms from Zone 2     Second Trimester of Pregnancy The second trimester is from week 14 through week 27 (months 4 through 6).  The second trimester is often a time when you feel your best. Your body has adjusted to being pregnant, and you begin to feel better physically. Usually, morning sickness has lessened or quit completely, you may have more energy, and you may have an increase in appetite. The second trimester is also a time when the fetus is growing rapidly. At the end of the sixth month, the fetus is about 9 inches long and weighs about 1 pounds. You will likely begin to feel the baby move (quickening) between 16 and 20 weeks of pregnancy. Body changes during your second trimester Your body continues to go through many  changes during your second trimester. The changes vary from woman to woman.  Your weight will continue to increase. You will notice your lower abdomen bulging out.  You may begin to get stretch marks on your hips, abdomen, and breasts.  You may develop headaches that can be relieved by medicines. The medicines should be approved by your health care provider.  You may urinate more often because the fetus is pressing on your bladder.  You may develop or continue to have heartburn as a result of your pregnancy.  You may develop constipation because certain hormones are causing the muscles that push waste through your intestines to slow down.  You may develop hemorrhoids or swollen, bulging veins (varicose veins).  You may have back pain. This is caused by: ? Weight gain. ? Pregnancy hormones that are relaxing the joints in your pelvis. ? A shift in weight and the muscles that support your balance.  Your breasts will continue to grow and they will continue to become tender.  Your gums may bleed and may be sensitive to brushing and flossing.  Dark spots or blotches (chloasma, mask of pregnancy) may develop on your face. This will likely fade after the baby is born.  A dark line from your belly button to the pubic area (linea nigra) may appear. This will likely fade after the baby is born.  You may have changes in your hair. These can include thickening of your hair, rapid growth, and changes in texture. Some women also have hair loss during or after pregnancy, or hair that feels dry or thin. Your hair will most likely return to normal after your baby is born.  What to expect at prenatal visits During a routine prenatal visit:  You will be weighed to make sure you and the fetus are growing normally.  Your blood pressure will be taken.  Your abdomen will be measured to track your baby's growth.  The fetal heartbeat will be listened to.  Any test results from the previous visit will  be discussed.  Your health care provider may ask you:  How you are feeling.  If you are feeling the baby move.  If you have had any abnormal symptoms, such as leaking fluid, bleeding, severe headaches, or abdominal cramping.  If you are using any tobacco products, including cigarettes, chewing tobacco, and electronic cigarettes.  If you have any questions.  Other tests that may be performed during your second trimester include:  Blood tests that check for: ? Low iron levels (anemia). ? High blood sugar that affects pregnant women (gestational diabetes) between 70 and 28 weeks. ? Rh antibodies. This is to check for a protein on red blood cells (Rh factor).  Urine tests to check for infections, diabetes, or protein in the urine.  An ultrasound to confirm the proper growth and development of the baby.  An amniocentesis to check for possible genetic problems.  Fetal screens for spina bifida and Down syndrome.  HIV (human immunodeficiency virus) testing. Routine prenatal testing includes screening for HIV, unless you choose not to have this test.  Follow these instructions at home: Medicines  Follow your health care provider's instructions regarding medicine use. Specific medicines may be either safe or unsafe to take during pregnancy.  Take a prenatal vitamin that contains at least 600 micrograms (mcg) of folic acid.  If you develop constipation, try taking a stool softener if your health care provider approves. Eating and drinking  Eat a balanced diet that includes fresh fruits and vegetables, whole grains, good sources of protein such as meat, eggs, or tofu, and low-fat dairy. Your health care provider will help you determine the amount of weight gain that is right for you.  Avoid raw meat and uncooked cheese. These carry germs that can cause birth defects in the baby.  If you have low calcium intake from food, talk to your health care provider about whether you should take  a daily calcium supplement.  Limit foods that are high in fat and processed sugars, such as fried and sweet foods.  To prevent constipation: ? Drink enough fluid to keep your urine clear or pale yellow. ? Eat foods that are high in fiber, such as fresh fruits and vegetables, whole grains, and beans. Activity  Exercise only as directed by your health care provider. Most women can continue their usual exercise routine during pregnancy. Try to exercise for 30 minutes at least 5 days a week. Stop exercising if you experience uterine contractions.  Avoid heavy lifting, wear low heel shoes, and practice good posture.  A sexual relationship may be continued unless your health care provider directs you otherwise. Relieving pain and discomfort  Wear a good support bra to prevent discomfort from breast tenderness.  Take warm sitz baths to soothe any pain or discomfort caused by hemorrhoids. Use hemorrhoid cream if your health care provider approves.  Rest with your legs elevated if you have leg cramps or low back pain.  If you develop varicose veins, wear support hose. Elevate your feet for 15 minutes, 3-4 times a day. Limit salt in your diet. Prenatal Care  Write down your questions. Take them to your prenatal visits.  Keep all your prenatal visits as told by your health care provider. This is important. Safety  Wear your seat belt at all times when driving.  Make a list of emergency phone numbers, including numbers for family, friends, the hospital, and police and fire departments. General instructions  Ask your health care provider for a referral to a local prenatal education class. Begin classes no later than the beginning of month 6 of your pregnancy.  Ask for help if you have counseling or nutritional needs during pregnancy. Your health care provider can offer advice or refer you to specialists for help with various needs.  Do not use hot tubs, steam rooms, or saunas.  Do not  douche or use tampons or scented sanitary pads.  Do not cross your legs for long periods of time.  Avoid cat litter boxes and soil used by cats. These carry germs that can cause birth defects in the baby and possibly loss of the fetus by miscarriage or stillbirth.  Avoid all smoking, herbs, alcohol, and unprescribed drugs. Chemicals in these products can affect the formation and growth of the baby.  Do not use any products that contain nicotine or  tobacco, such as cigarettes and e-cigarettes. If you need help quitting, ask your health care provider.  Visit your dentist if you have not gone yet during your pregnancy. Use a soft toothbrush to brush your teeth and be gentle when you floss. Contact a health care provider if:  You have dizziness.  You have mild pelvic cramps, pelvic pressure, or nagging pain in the abdominal area.  You have persistent nausea, vomiting, or diarrhea.  You have a bad smelling vaginal discharge.  You have pain when you urinate. Get help right away if:  You have a fever.  You are leaking fluid from your vagina.  You have spotting or bleeding from your vagina.  You have severe abdominal cramping or pain.  You have rapid weight gain or weight loss.  You have shortness of breath with chest pain.  You notice sudden or extreme swelling of your face, hands, ankles, feet, or legs.  You have not felt your baby move in over an hour.  You have severe headaches that do not go away when you take medicine.  You have vision changes. Summary  The second trimester is from week 14 through week 27 (months 4 through 6). It is also a time when the fetus is growing rapidly.  Your body goes through many changes during pregnancy. The changes vary from woman to woman.  Avoid all smoking, herbs, alcohol, and unprescribed drugs. These chemicals affect the formation and growth your baby.  Do not use any tobacco products, such as cigarettes, chewing tobacco, and  e-cigarettes. If you need help quitting, ask your health care provider.  Contact your health care provider if you have any questions. Keep all prenatal visits as told by your health care provider. This is important. This information is not intended to replace advice given to you by your health care provider. Make sure you discuss any questions you have with your health care provider. Document Released: 07/29/2001 Document Revised: 01/10/2016 Document Reviewed: 10/05/2012 Elsevier Interactive Patient Education  2017 Else[employer] Inc.  PROTECT YOURSELF & YOUR BABY FROM THE FLU! Because you are pregnant, we at [facility], along with the Centers for Disease Control (CDC), recommend that you receive the flu vaccine to protect yourself and your baby from the flu. The flu is more likely to cause severe illness in pregnant women than in women of reproductive age who are not pregnant. Changes in the immune system, heart, and lungs during pregnancy make pregnant women (and women up to two weeks postpartum) more prone to severe illness from flu, including illness resulting in hospitalization. Flu also may be harmful for a pregnant woman's developing baby. A common flu symptom is fever, which may be associated with neural tube defects and other adverse outcomes for a developing baby. Getting vaccinated can also help protect a baby after birth from flu. (Mom passes antibodies onto the developing baby during her pregnancy.)  A Flu Vaccine is the Best Protection Against Flu Getting a flu vaccine is the first and most important step in protecting against flu. Pregnant women should get a flu shot and not the live attenuated influenza vaccine (LAIV), also known as nasal spray flu vaccine. Flu vaccines given during pregnancy help protect both the mother and her baby from flu. Vaccination has been shown to reduce the risk of flu-associated acute respiratory infection in pregnant women by up to one-half. A 2018 study showed  that getting a flu shot reduced a pregnant woman's risk of being hospitalized with flu by  an average of 40 percent. Pregnant women who get a flu vaccine are also helping to protect their babies from flu illness for the first several months after their birth, when they are too young to get vaccinated.   A Long Record of Safety for Flu Shots in Pregnant Women Flu shots have been given to millions of pregnant women over many years with a good safety record. There is a lot of evidence that flu vaccines can be given safely during pregnancy; though these data are limited for the first trimester. The CDC recommends that pregnant women get vaccinated during any trimester of their pregnancy. It is very important for pregnant women to get the flu shot.   Other Preventive Actions In addition to getting a flu shot, pregnant women should take the same everyday preventive actions the CDC recommends of everyone, including covering coughs, washing hands often, and avoiding people who are sick.  Symptoms and Treatment If you get sick with flu symptoms call your doctor right away. There are antiviral drugs that can treat flu illness and prevent serious flu complications. The CDC recommends prompt treatment for people who have influenza infection or suspected influenza infection and who are at high risk of serious flu complications, such as people with asthma, diabetes (including gestational diabetes), or heart disease. Early treatment of influenza in hospitalized pregnant women has been shown to reduce the length of the hospital stay.  Symptoms Flu symptoms include fever, cough, sore throat, runny or stuffy nose, body aches, headache, chills and fatigue. Some people may also have vomiting and diarrhea. People may be infected with the flu and have respiratory symptoms without a fever.  Early Treatment is Important for Pregnant Women Treatment should begin as soon as possible because antiviral drugs work best when  started early (within 48 hours after symptoms start). Antiviral drugs can make your flu illness milder and make you feel better faster. They may also prevent serious health problems that can result from flu illness. Oral oseltamivir (Tamiflu) is the preferred treatment for pregnant women because it has the most studies available to suggest that it is safe and beneficial. Antiviral drugs require a prescription from your provider. Having a fever caused by flu infection or other infections early in pregnancy may be linked to birth defects in a baby. In addition to taking antiviral drugs, pregnant women who get a fever should treat their fever with Tylenol (acetaminophen) and contact their provider immediately.  When to Seek Emergency Medical Care If you are pregnant and have any of these signs, seek care immediately:  Difficulty breathing or shortness of breath  Pain or pressure in the chest or abdomen  Sudden dizziness  Confusion  Severe or persistent vomiting  High fever that is not responding to Tylenol (or store brand equivalent)  Decreased or no movement of your baby  [email or web address].[email or web address]

## 2020-02-06 NOTE — Progress Notes (Signed)
LOW-RISK PREGNANCY VISIT Patient name: Brenda Liu MRN [id number]  Date of birth: Jul 18, 1986 Chief Complaint:   Routine Prenatal Visit  History of Present Illness:   Brenda Liu is a 34 y.o. [id number] female at [redacted]w[redacted]d with an Estimated Date of Delivery: 07/23/20 being seen today for ongoing management of a low-risk pregnancy.  Depression screen [facility] 2/9 01/12/2020  Decreased Interest 0  Down, Depressed, Hopeless 0  PHQ - 2 Score 0  Altered sleeping 0  Tired, decreased energy 3  Change in appetite 0  Feeling bad or failure about yourself  0  Trouble concentrating 0  Moving slowly or fidgety/restless 0  Suicidal thoughts 0  PHQ-9 Score 3  Difficult doing work/chores Not difficult at all    Today she reports had spotting earlier in pregnancy, had 1.8cm SCH at 6wks. No bleeding again until yesterday, had sex yesterday am, then pink then red bleeding last night. Went to [employer], had outpt u/s today, FHR 136, fundal placental w/o abruption, AFI normal, cx appears closed. Reports no bleeding today. Denies current abnormal discharge, itching/odor/irritation. Used otc monistat recently for suspected yeast infection.   Contractions: Not present. Vag. Bleeding: None.  Movement: Present. denies leaking of fluid. Review of Systems:   Pertinent items are noted in HPI Denies abnormal vaginal discharge w/ itching/odor/irritation, headaches, visual changes, shortness of breath, chest pain, abdominal pain, severe nausea/vomiting, or problems with urination or bowel movements unless otherwise stated above. Pertinent History Reviewed:  Reviewed past medical,surgical, social, obstetrical and family history.  Reviewed problem list, medications and allergies. Physical Assessment:   Vitals:   02/06/20 1420  BP: 104/62  Pulse: (!) 113  Weight: (!) 326 lb 6.4 oz (148.1 kg)  Body mass index is 51.12 kg/m.        Physical Examination:   General appearance: Well appearing, and in no  distress  Mental status: Alert, oriented to person, place, and time  Skin: Warm & dry  Cardiovascular: Normal heart rate noted  Respiratory: Normal respiratory effort, no distress  Abdomen: Soft, gravid, nontender  Pelvic: spec exam: cx visually closed, small amt light brown d/c         Extremities: Edema: Trace  Fetal Status: Fetal Heart Rate (bpm): +u/s   Movement: Present    Hard to get FHT w/ doppler d/t body habitus, informal u/s w/ +FCA and active fetus  Chaperone: Federico Flake    Results for orders placed or performed in visit on 02/06/20 (from the past 24 hour(s))  POC Urinalysis Dipstick OB   Collection Time: 02/06/20  2:22 PM  Result Value Ref Range   Color, UA     Clarity, UA     Glucose, UA Negative Negative   Bilirubin, UA     Ketones, UA moderate    Spec Grav, UA     Blood, UA n    pH, UA     POC,PROTEIN,UA Negative Negative, Trace, Small (1+), Moderate (2+), Large (3+), 4+   Urobilinogen, UA     Nitrite, UA n    Leukocytes, UA Negative Negative   Appearance     Odor      Assessment & Plan:  1) Low-risk pregnancy [id number] at [redacted]w[redacted]d with an Estimated Date of Delivery: 07/23/20   2) Vag bleeding in pregnancy, currently resolved, normal u/s today w/ [facility]. CV swab sent. Pelvic rest for now. Rh+   Meds:  Meds ordered this encounter  Medications  . Blood Pressure Monitor MISC    Sig:  For regular home bp monitoring during pregnancy    Dispense:  1 each    Refill:  0    Dx: z34.90, large cuff    Order Specific Question:   Supervising Provider    Answer:   Florian Buff [[id number]]   Labs/procedures today: spec exam  Plan:  Continue routine obstetrical care  Next visit: prefers will be in person for anatomy u/s    Reviewed: Preterm labor symptoms and general obstetric precautions including but not limited to vaginal bleeding, contractions, leaking of fluid and fetal movement were reviewed in detail with the patient.  All questions were answered. Does not have home  bp cuff. Rx faxed to [facility]. Check bp weekly, let us know if >140/90.   Follow-up: Return in about 2 weeks (around 02/20/2020) for LROB, [id number], CNM, in person.  Orders Placed This Encounter  Procedures  . Urine Culture  . US OB Comp + 14 Wk  . INTEGRATED 2  . POC Urinalysis Dipstick OB   Roma Schanz CNM, [facility] 02/06/2020 2:45 PM

## 2020-02-07 NOTE — Progress Notes (Signed)
This encounter was created in error - please disregard. Pt was scheduled as mychart visit- but didn't have bp cuff.

## 2020-02-08 ENCOUNTER — Other Ambulatory Visit: Payer: Self-pay | Admitting: Women's Health

## 2020-02-08 LAB — INTEGRATED 2
AFP MoM: 2.06
Alpha-Fetoprotein: 35.5 ng/mL
Crown Rump Length: 63 mm
DIA MoM: 1.9
DIA Value: 199.3 pg/mL
Estriol, Unconjugated: 0.54 ng/mL
Gest. Age on Collection Date: 12.6 weeks
Gestational Age: 16.1 weeks
Maternal Age at EDD: 33.9 yr
Nuchal Translucency (NT): 2 mm
Nuchal Translucency MoM: 1.42
Number of Fetuses: 1
PAPP-A MoM: 0.72
PAPP-A Value: 272.2 ng/mL
Test Results:: NEGATIVE
Weight: 323 [lb_av]
Weight: 323 [lb_av]
hCG MoM: 1.35
hCG Value: 23.7 IU/mL
uE3 MoM: 0.75

## 2020-02-08 LAB — URINE CULTURE

## 2020-02-08 LAB — CERVICOVAGINAL ANCILLARY ONLY
Bacterial Vaginitis (gardnerella): POSITIVE — AB
Candida Glabrata: NEGATIVE
Candida Vaginitis: NEGATIVE
Chlamydia: NEGATIVE
Comment: NEGATIVE
Comment: NEGATIVE
Comment: NEGATIVE
Comment: NEGATIVE
Comment: NEGATIVE
Comment: NORMAL
Neisseria Gonorrhea: NEGATIVE
Trichomonas: NEGATIVE

## 2020-02-08 MED ORDER — METRONIDAZOLE 500 MG PO TABS: 500.0000 mg | ORAL_TABLET | Freq: Two times a day (BID) | ORAL | 0 refills | Status: AC

## 2020-02-28 ENCOUNTER — Encounter: Payer: Self-pay | Admitting: Obstetrics & Gynecology

## 2020-02-28 ENCOUNTER — Ambulatory Visit (INDEPENDENT_AMBULATORY_CARE_PROVIDER_SITE_OTHER): Payer: Medicaid Other | Admitting: Obstetrics & Gynecology

## 2020-02-28 ENCOUNTER — Ambulatory Visit (INDEPENDENT_AMBULATORY_CARE_PROVIDER_SITE_OTHER): Payer: Medicaid Other

## 2020-02-28 VITALS — BP 117/66 | HR 114 | Wt 334.0 lb

## 2020-02-28 DIAGNOSIS — Z331 Pregnant state, incidental: Secondary | ICD-10-CM

## 2020-02-28 DIAGNOSIS — Z3A19 19 weeks gestation of pregnancy: Secondary | ICD-10-CM

## 2020-02-28 DIAGNOSIS — Z3482 Encounter for supervision of other normal pregnancy, second trimester: Secondary | ICD-10-CM

## 2020-02-28 DIAGNOSIS — Z3480 Encounter for supervision of other normal pregnancy, unspecified trimester: Secondary | ICD-10-CM

## 2020-02-28 DIAGNOSIS — Z363 Encounter for antenatal screening for malformations: Secondary | ICD-10-CM | POA: Diagnosis not present

## 2020-02-28 DIAGNOSIS — Z1389 Encounter for screening for other disorder: Secondary | ICD-10-CM

## 2020-02-28 DIAGNOSIS — Z98891 History of uterine scar from previous surgery: Secondary | ICD-10-CM

## 2020-02-28 LAB — POCT URINALYSIS DIPSTICK OB
Blood, UA: NEGATIVE
Glucose, UA: NEGATIVE
Ketones, UA: NEGATIVE
Leukocytes, UA: NEGATIVE
Nitrite, UA: NEGATIVE
POC,PROTEIN,UA: NEGATIVE

## 2020-02-28 NOTE — Progress Notes (Signed)
[place] 19+2 wks,breech,posterior placenta gr 0,normal ovaries,cx 4.9 cm,svp of fluid 4.7 cm,fhr 144 bpm,EFW 355 g 96%,anatomy complete,limited view because of pt body habitus

## 2020-02-28 NOTE — Progress Notes (Signed)
   LOW-RISK PREGNANCY VISIT Patient name: Brenda Liu MRN [id number]  Date of birth: 03-Aug-1986 Chief Complaint:   Routine Prenatal Visit  History of Present Illness:   Brenda Liu is a 34 y.o. [id number] female at [redacted]w[redacted]d with an Estimated Date of Delivery: 07/23/20 being seen today for ongoing management of a low-risk pregnancy.  Depression screen [facility] 2/9 01/12/2020  Decreased Interest 0  Down, Depressed, Hopeless 0  PHQ - 2 Score 0  Altered sleeping 0  Tired, decreased energy 3  Change in appetite 0  Feeling bad or failure about yourself  0  Trouble concentrating 0  Moving slowly or fidgety/restless 0  Suicidal thoughts 0  PHQ-9 Score 3  Difficult doing work/chores Not difficult at all    Today she reports no complaints. Contractions: Not present. Vag. Bleeding: None.  Movement: Present. denies leaking of fluid. Review of Systems:   Pertinent items are noted in HPI Denies abnormal vaginal discharge w/ itching/odor/irritation, headaches, visual changes, shortness of breath, chest pain, abdominal pain, severe nausea/vomiting, or problems with urination or bowel movements unless otherwise stated above. Pertinent History Reviewed:  Reviewed past medical,surgical, social, obstetrical and family history.  Reviewed problem list, medications and allergies. Physical Assessment:   Vitals:   02/28/20 1559  BP: 117/66  Pulse: (!) 114  Weight: (!) 334 lb (151.5 kg)  Body mass index is 52.31 kg/m.        Physical Examination:   General appearance: Well appearing, and in no distress  Mental status: Alert, oriented to person, place, and time  Skin: Warm & dry  Cardiovascular: Normal heart rate noted  Respiratory: Normal respiratory effort, no distress  Abdomen: Soft, gravid, nontender  Pelvic: Cervical exam deferred         Extremities: Edema: Trace  Fetal Status:     Movement: Present    Chaperone: n/a    Results for orders placed or performed in visit on 02/28/20 (from  the past 24 hour(s))  POC Urinalysis Dipstick OB   Collection Time: 02/28/20  3:57 PM  Result Value Ref Range   Color, UA     Clarity, UA     Glucose, UA Negative Negative   Bilirubin, UA     Ketones, UA n    Spec Grav, UA     Blood, UA n    pH, UA     POC,PROTEIN,UA Negative Negative, Trace, Small (1+), Moderate (2+), Large (3+), 4+   Urobilinogen, UA     Nitrite, UA n    Leukocytes, UA Negative Negative   Appearance     Odor      Assessment & Plan:  1) Low-risk pregnancy [id number] at [redacted]w[redacted]d with an Estimated Date of Delivery: 07/23/20   2) Previous C section x 2, , wants repeat + BTL   Meds: No orders of the defined types were placed in this encounter.  Labs/procedures today:   Plan:  Continue routine obstetrical care  Next visit: prefers in person    Reviewed: Preterm labor symptoms and general obstetric precautions including but not limited to vaginal bleeding, contractions, leaking of fluid and fetal movement were reviewed in detail with the patient.  All questions were answered.  home bp cuff. Rx faxed to . Check bp weekly, let us know if >140/90.   Follow-up: Return in about 4 weeks (around 03/27/2020) for LROB.  Orders Placed This Encounter  Procedures  . POC Urinalysis Dipstick OB   Brenda Liu  02/28/2020 4:58 PM

## 2020-03-06 ENCOUNTER — Encounter: Payer: Self-pay | Admitting: *Deleted

## 2020-03-27 ENCOUNTER — Encounter: Payer: Medicaid Other | Admitting: Obstetrics & Gynecology

## 2020-04-27 ENCOUNTER — Other Ambulatory Visit: Payer: Medicaid Other

## 2020-04-27 ENCOUNTER — Ambulatory Visit (INDEPENDENT_AMBULATORY_CARE_PROVIDER_SITE_OTHER): Payer: Medicaid Other | Admitting: Obstetrics and Gynecology

## 2020-04-27 ENCOUNTER — Other Ambulatory Visit: Payer: Self-pay

## 2020-04-27 ENCOUNTER — Encounter: Payer: Medicaid Other | Admitting: Obstetrics & Gynecology

## 2020-04-27 VITALS — BP 135/69 | HR 98 | Wt 337.0 lb

## 2020-04-27 DIAGNOSIS — Z3480 Encounter for supervision of other normal pregnancy, unspecified trimester: Secondary | ICD-10-CM | POA: Diagnosis not present

## 2020-04-27 DIAGNOSIS — Z1389 Encounter for screening for other disorder: Secondary | ICD-10-CM | POA: Diagnosis not present

## 2020-04-27 DIAGNOSIS — Z3A27 27 weeks gestation of pregnancy: Secondary | ICD-10-CM

## 2020-04-27 DIAGNOSIS — Z331 Pregnant state, incidental: Secondary | ICD-10-CM | POA: Diagnosis not present

## 2020-04-27 DIAGNOSIS — Z131 Encounter for screening for diabetes mellitus: Secondary | ICD-10-CM

## 2020-04-27 LAB — POCT URINALYSIS DIPSTICK OB
Blood, UA: NEGATIVE
Glucose, UA: NEGATIVE
Ketones, UA: NEGATIVE
Leukocytes, UA: NEGATIVE
Nitrite, UA: NEGATIVE
POC,PROTEIN,UA: NEGATIVE

## 2020-04-27 NOTE — Progress Notes (Signed)
LOW-RISK PREGNANCY VISIT Patient name: Brenda Liu MRN [id number]  Date of birth: 1986-01-24 Chief Complaint:   Routine Prenatal Visit (PN2)  History of Present Illness:   Brenda Liu is a 34 y.o. [phone] female at [redacted]w[redacted]d prior cesarean for repeat and BTL 39 wk with an Estimated Date of Delivery: 07/23/20 being seen today for ongoing management of a low-risk pregnancy.  Depression screen [facility] 2/9 04/27/2020 01/12/2020  Decreased Interest 0 0  Down, Depressed, Hopeless 0 0  PHQ - 2 Score 0 0  Altered sleeping 0 0  Tired, decreased energy 0 3  Change in appetite 0 0  Feeling bad or failure about yourself  0 0  Trouble concentrating 0 0  Moving slowly or fidgety/restless 0 0  Suicidal thoughts 0 0  PHQ-9 Score 0 3  Difficult doing work/chores - Not difficult at all    Today she reports history of intermittent vaginal bleeding that has currently resolved. Contractions: Not present. Vag. Bleeding: None.  Movement: Present Denies leaking of fluid. Review of Systems:   Pertinent items are noted in HPI Denies abnormal vaginal discharge w/ itching/odor/irritation, headaches, visual changes, shortness of breath, chest pain, abdominal pain, severe nausea/vomiting, or problems with urination or bowel movements unless otherwise stated above. Pertinent History Reviewed:  Reviewed past medical,surgical, social, obstetrical and family history.  Reviewed problem list, medications and allergies. Physical Assessment:   Vitals:   04/27/20 0919  BP: 135/69  Pulse: 98  Weight: (!) 337 lb (152.9 kg)  Body mass index is 52.78 kg/m.        Physical Examination:   General appearance: Well appearing, and in no distress  Mental status: Alert, oriented to person, place, and time  Skin: Warm & dry  Cardiovascular: Normal heart rate noted  Respiratory: Normal respiratory effort, no distress  Abdomen: Soft, gravid, nontender  Pelvic: Cervical exam deferred         Extremities: Edema:  None  Fetal Status: Fetal Heart Rate (bpm): 141   Movement: Present    Chaperone: Nikki Dom    Results for orders placed or performed in visit on 04/27/20 (from the past 24 hour(s))  POC Urinalysis Dipstick OB   Collection Time: 04/27/20  9:19 AM  Result Value Ref Range   Color, UA     Clarity, UA     Glucose, UA Negative Negative   Bilirubin, UA     Ketones, UA neg    Spec Grav, UA     Blood, UA neg    pH, UA     POC,PROTEIN,UA Negative Negative, Trace, Small (1+), Moderate (2+), Large (3+), 4+   Urobilinogen, UA     Nitrite, UA neg    Leukocytes, UA Negative Negative   Appearance     Odor      Assessment & Plan:  1) Low-risk pregnancy [id number] at [redacted]w[redacted]d with an Estimated Date of Delivery: 07/23/20  2; prior cesarean, not for tolac, desires repeat and BTL  2) Vaginal bleeding in pregnancy, currently resolved. Most recent ultrasound was normal. Will schedule ultrasound for next week to measure fetal growth.as obesity limits accuracy of growth u/s, and partner has concerns.   Meds: No orders of the defined types were placed in this encounter.  Labs/procedures today: None  Plan:  Continue routine obstetrical care.  Next visit: In person for ultrasound  Reviewed: Preterm labor symptoms and general obstetric precautions including but not limited to vaginal bleeding, contractions, leaking of fluid and fetal movement were reviewed  in detail with the patient.  All questions were answered. Has home bp cuff. Check bp weekly, let us know if >140/90.   Follow-up: No follow-ups on file.  Orders Placed This Encounter  Procedures  . POC Urinalysis Dipstick OB   By signing my name below, I, Nikki Dom, attest that this documentation has been prepared under the direction and in the presence of Eure, Amaryllis Dyke, MD. Electronically Signed: Nikki Dom Medical Scribe. 04/27/20. 9:41 AM.  I personally performed the services described in this documentation, which was SCRIBED in my  presence. The recorded information has been reviewed and considered accurate. It has been edited as necessary during review. Tilda Burrow, MD

## 2020-04-28 LAB — CBC
Hematocrit: 33.5 % — ABNORMAL LOW (ref 34.0–46.6)
Hemoglobin: 10.5 g/dL — ABNORMAL LOW (ref 11.1–15.9)
MCH: 27.6 pg (ref 26.6–33.0)
MCHC: 31.3 g/dL — ABNORMAL LOW (ref 31.5–35.7)
MCV: 88 fL (ref 79–97)
Platelets: 434 10*3/uL (ref 150–450)
RBC: 3.81 x10E6/uL (ref 3.77–5.28)
RDW: 13.7 % (ref 11.7–15.4)
WBC: 15.5 10*3/uL — ABNORMAL HIGH (ref 3.4–10.8)

## 2020-04-28 LAB — HIV ANTIBODY (ROUTINE TESTING W REFLEX): HIV Screen 4th Generation wRfx: NONREACTIVE

## 2020-04-28 LAB — ANTIBODY SCREEN: Antibody Screen: NEGATIVE

## 2020-04-28 LAB — GLUCOSE TOLERANCE, 2 HOURS W/ 1HR
Glucose, 1 hour: 151 mg/dL (ref 65–179)
Glucose, 2 hour: 138 mg/dL (ref 65–152)
Glucose, Fasting: 90 mg/dL (ref 65–91)

## 2020-04-28 LAB — RPR: RPR Ser Ql: NONREACTIVE

## 2020-05-02 ENCOUNTER — Other Ambulatory Visit: Payer: Self-pay | Admitting: Obstetrics and Gynecology

## 2020-05-02 DIAGNOSIS — O26843 Uterine size-date discrepancy, third trimester: Secondary | ICD-10-CM

## 2020-05-03 ENCOUNTER — Other Ambulatory Visit: Payer: Self-pay

## 2020-05-03 ENCOUNTER — Ambulatory Visit (INDEPENDENT_AMBULATORY_CARE_PROVIDER_SITE_OTHER): Payer: Medicaid Other | Admitting: Obstetrics & Gynecology

## 2020-05-03 ENCOUNTER — Ambulatory Visit (INDEPENDENT_AMBULATORY_CARE_PROVIDER_SITE_OTHER): Payer: Medicaid Other

## 2020-05-03 ENCOUNTER — Encounter: Payer: Self-pay | Admitting: Obstetrics & Gynecology

## 2020-05-03 VITALS — BP 133/71 | HR 105 | Wt 336.0 lb

## 2020-05-03 DIAGNOSIS — O26843 Uterine size-date discrepancy, third trimester: Secondary | ICD-10-CM | POA: Diagnosis not present

## 2020-05-03 DIAGNOSIS — Z3A28 28 weeks gestation of pregnancy: Secondary | ICD-10-CM

## 2020-05-03 DIAGNOSIS — Z3483 Encounter for supervision of other normal pregnancy, third trimester: Secondary | ICD-10-CM | POA: Diagnosis not present

## 2020-05-03 DIAGNOSIS — Z331 Pregnant state, incidental: Secondary | ICD-10-CM | POA: Diagnosis not present

## 2020-05-03 DIAGNOSIS — Z3480 Encounter for supervision of other normal pregnancy, unspecified trimester: Secondary | ICD-10-CM

## 2020-05-03 DIAGNOSIS — Z1389 Encounter for screening for other disorder: Secondary | ICD-10-CM

## 2020-05-03 LAB — POCT URINALYSIS DIPSTICK OB
Blood, UA: NEGATIVE
Glucose, UA: NEGATIVE
Leukocytes, UA: NEGATIVE
Nitrite, UA: NEGATIVE
POC,PROTEIN,UA: NEGATIVE

## 2020-05-03 NOTE — Progress Notes (Signed)
[place] 28+6 wks,cephalic,CX 4 cm,fundal placenta gr 1,afi 21 cm,fhr 140 bpm,efw 1499 g 78 %,AC 92%,BPD 92%,limited view because of pt body habitus

## 2020-05-03 NOTE — Progress Notes (Signed)
LOW-RISK PREGNANCY VISIT Patient name: Brenda Liu MRN [id number]  Date of birth: Jan 17, 1986 Chief Complaint:   Routine Prenatal Visit (Ultrasound)  History of Present Illness:   Brenda Liu is a 34 y.o. [id number] female at [redacted]w[redacted]d with an Estimated Date of Delivery: 07/23/20 being seen today for ongoing management of a low-risk pregnancy.  Depression screen [facility] 2/9 04/27/2020 01/12/2020  Decreased Interest 0 0  Down, Depressed, Hopeless 0 0  PHQ - 2 Score 0 0  Altered sleeping 0 0  Tired, decreased energy 0 3  Change in appetite 0 0  Feeling bad or failure about yourself  0 0  Trouble concentrating 0 0  Moving slowly or fidgety/restless 0 0  Suicidal thoughts 0 0  PHQ-9 Score 0 3  Difficult doing work/chores - Not difficult at all    Today she reports no complaints. Contractions: Not present. Vag. Bleeding: None.  Movement: Present. denies leaking of fluid. Review of Systems:   Pertinent items are noted in HPI Denies abnormal vaginal discharge w/ itching/odor/irritation, headaches, visual changes, shortness of breath, chest pain, abdominal pain, severe nausea/vomiting, or problems with urination or bowel movements unless otherwise stated above. Pertinent History Reviewed:  Reviewed past medical,surgical, social, obstetrical and family history.  Reviewed problem list, medications and allergies. Physical Assessment:   Vitals:   05/03/20 1626  BP: 133/71  Pulse: (!) 105  Weight: (!) 336 lb (152.4 kg)  Body mass index is 52.63 kg/m.        Physical Examination:   General appearance: Well appearing, and in no distress  Mental status: Alert, oriented to person, place, and time  Skin: Warm & dry  Cardiovascular: Normal heart rate noted  Respiratory: Normal respiratory effort, no distress  Abdomen: Soft, gravid, nontender  Pelvic: deferred        Extremities: Edema: None  Fetal Status: Fetal Heart Rate (bpm): 140   Movement: Present    Chaperone: n/a    Results  for orders placed or performed in visit on 05/03/20 (from the past 24 hour(s))  POC Urinalysis Dipstick OB   Collection Time: 05/03/20  4:29 PM  Result Value Ref Range   Color, UA     Clarity, UA     Glucose, UA Negative Negative   Bilirubin, UA     Ketones, UA small    Spec Grav, UA     Blood, UA neg    pH, UA     POC,PROTEIN,UA Negative Negative, Trace, Small (1+), Moderate (2+), Large (3+), 4+   Urobilinogen, UA     Nitrite, UA neg    Leukocytes, UA Negative Negative   Appearance     Odor      Assessment & Plan:  1) Low-risk pregnancy [id number] at [redacted]w[redacted]d with an Estimated Date of Delivery: 07/23/20   2) EFW 79%, for repeat C section + BTL   Meds: No orders of the defined types were placed in this encounter.  Labs/procedures today:   Plan:  Continue routine obstetrical care  Next visit: prefers in person    Reviewed: Preterm labor symptoms and general obstetric precautions including but not limited to vaginal bleeding, contractions, leaking of fluid and fetal movement were reviewed in detail with the patient.  All questions were answered. Has home bp cuff. Rx faxed to . Check bp weekly, let us know if >140/90.   Follow-up: Return in about 3 weeks (around 05/24/2020) for LROB.  Orders Placed This Encounter  Procedures  . POC Urinalysis Dipstick  OB   Lazaro Arms  05/03/2020 4:56 PM

## 2023-08-04 ENCOUNTER — Encounter (INDEPENDENT_AMBULATORY_CARE_PROVIDER_SITE_OTHER): Payer: Self-pay

## 2023-08-05 ENCOUNTER — Encounter (INDEPENDENT_AMBULATORY_CARE_PROVIDER_SITE_OTHER): Payer: Medicaid Other | Admitting: Family Medicine

## 2024-08-16 ENCOUNTER — Encounter (HOSPITAL_BASED_OUTPATIENT_CLINIC_OR_DEPARTMENT_OTHER): Payer: Self-pay | Admitting: Internal Medicine

## 2024-08-16 DIAGNOSIS — R0683 Snoring: Secondary | ICD-10-CM

## 2024-08-16 DIAGNOSIS — R5383 Other fatigue: Secondary | ICD-10-CM
